# Patient Record
Sex: Female | Born: 1950
Health system: Southern US, Community
[De-identification: ages and names within clinical notes are randomized; demographics above are authoritative.]

## PROBLEM LIST (undated history)

## (undated) DIAGNOSIS — I10 Essential (primary) hypertension: Secondary | ICD-10-CM

## (undated) HISTORY — DX: Essential (primary) hypertension: I10

## (undated) HISTORY — PX: TUBAL LIGATION: SHX77

## (undated) HISTORY — PX: BREAST CYST EXCISION: SHX579

---

## 1999-12-29 ENCOUNTER — Encounter: Admission: RE | Admit: 1999-12-29 | Discharge: 1999-12-29 | Payer: Self-pay | Admitting: Obstetrics and Gynecology

## 1999-12-29 ENCOUNTER — Encounter: Payer: Self-pay | Admitting: Obstetrics and Gynecology

## 2000-10-07 ENCOUNTER — Encounter: Admission: RE | Admit: 2000-10-07 | Discharge: 2000-10-07 | Payer: Self-pay | Admitting: Obstetrics and Gynecology

## 2000-10-07 ENCOUNTER — Encounter: Payer: Self-pay | Admitting: Obstetrics and Gynecology

## 2000-10-27 ENCOUNTER — Encounter (INDEPENDENT_AMBULATORY_CARE_PROVIDER_SITE_OTHER): Payer: Self-pay | Admitting: *Deleted

## 2000-10-27 ENCOUNTER — Ambulatory Visit (HOSPITAL_BASED_OUTPATIENT_CLINIC_OR_DEPARTMENT_OTHER): Admission: RE | Admit: 2000-10-27 | Discharge: 2000-10-27 | Payer: Self-pay | Admitting: *Deleted

## 2000-11-08 HISTORY — PX: BREAST SURGERY: SHX581

## 2001-11-24 ENCOUNTER — Encounter: Admission: RE | Admit: 2001-11-24 | Discharge: 2001-11-24 | Payer: Self-pay | Admitting: Obstetrics and Gynecology

## 2001-11-24 ENCOUNTER — Encounter: Payer: Self-pay | Admitting: Obstetrics and Gynecology

## 2003-04-15 ENCOUNTER — Encounter: Payer: Self-pay | Admitting: Obstetrics and Gynecology

## 2003-04-15 ENCOUNTER — Encounter: Admission: RE | Admit: 2003-04-15 | Discharge: 2003-04-15 | Payer: Self-pay | Admitting: Obstetrics and Gynecology

## 2004-05-04 ENCOUNTER — Encounter: Admission: RE | Admit: 2004-05-04 | Discharge: 2004-05-04 | Payer: Self-pay | Admitting: Obstetrics and Gynecology

## 2005-05-10 ENCOUNTER — Encounter: Admission: RE | Admit: 2005-05-10 | Discharge: 2005-05-10 | Payer: Self-pay | Admitting: Obstetrics and Gynecology

## 2006-05-12 ENCOUNTER — Encounter: Admission: RE | Admit: 2006-05-12 | Discharge: 2006-05-12 | Payer: Self-pay | Admitting: Obstetrics and Gynecology

## 2006-08-25 ENCOUNTER — Ambulatory Visit: Payer: Self-pay | Admitting: Gastroenterology

## 2006-09-13 ENCOUNTER — Ambulatory Visit: Payer: Self-pay | Admitting: Gastroenterology

## 2007-05-19 ENCOUNTER — Encounter: Admission: RE | Admit: 2007-05-19 | Discharge: 2007-05-19 | Payer: Self-pay | Admitting: Obstetrics and Gynecology

## 2008-05-29 ENCOUNTER — Encounter: Admission: RE | Admit: 2008-05-29 | Discharge: 2008-05-29 | Payer: Self-pay | Admitting: Obstetrics and Gynecology

## 2009-06-23 ENCOUNTER — Encounter: Admission: RE | Admit: 2009-06-23 | Discharge: 2009-06-23 | Payer: Self-pay | Admitting: Obstetrics and Gynecology

## 2010-07-21 ENCOUNTER — Encounter: Admission: RE | Admit: 2010-07-21 | Discharge: 2010-07-21 | Payer: Self-pay | Admitting: Obstetrics and Gynecology

## 2011-03-26 NOTE — Op Note (Signed)
[facility]  Patient:    Lauren Williams, Lauren Williams                     MRN: [id number] Proc. Date: 10/27/00 Adm. Date:  [id number] Attending:  Kandis Mannan CC:         Katherine Roan, M.D.  Dario Guardian, M.D.   Operative Report  CCS [phone].  PREOPERATIVE DIAGNOSIS:  Mass, right breast.  POSTOPERATIVE DIAGNOSIS:  Mass, right breast.  PROCEDURE:  Excision of mass, right breast.  SURGEON:  Maisie Fus B. Samuella Cota, M.D.  ANESTHESIA:  1% Xylocaine local with anesthesia monitoring.  ANESTHESIOLOGIST:  Cliffton Asters. Ivin Booty, M.D., C.R.N.A. Daphine Deutscher.  DESCRIPTION OF PROCEDURE:  The patient was taken to the operating room and placed on the table in the supine position.  The right breast was prepped and draped as a sterile field.  A circumareolar incision was outlined from about 2 oclock to 7 oclock.  Xylocaine 1% local was used to infiltrate the skin and underlying breast tissue.  The circumareolar incision was then made and using the cautery, the tissue just beneath the nipple and areola was excised. The patient had a palpable transverse oblong mass.  A suture was placed through this for traction, and then the mass was excised with some normal-appearing breast tissue.  Nothing that I cut across looked suspicious for malignancy.  After the specimen had been removed, the wound was irrigated, hemostasis was obtained.  The wound was closed with several interrupted sutures of 3-0 Vicryl, and the circumareolar incision was then closed with a subcuticular running suture of 5-0 Vicryl.  Benzoin and half-inch Steri-Strips were used to reinforce the skin closure, a pressure dressing using 4 x 4s, ABD, and four-inch Hypafix was applied.  The specimen was sent fresh, but no frozen section was requested.  The patient seemed to tolerate the procedure well, was taken to the PACU in satisfactory condition. DD:  10/27/00 TD:  10/28/00 Job: [id number]

## 2011-07-20 ENCOUNTER — Other Ambulatory Visit: Payer: Self-pay | Admitting: Gynecology

## 2011-07-20 DIAGNOSIS — Z1231 Encounter for screening mammogram for malignant neoplasm of breast: Secondary | ICD-10-CM

## 2011-07-30 ENCOUNTER — Ambulatory Visit
Admission: RE | Admit: 2011-07-30 | Discharge: 2011-07-30 | Disposition: A | Discharge status: Home / Self Care | Payer: BC Managed Care – PPO | Source: Ambulatory Visit | Attending: Gynecology | Admitting: Gynecology

## 2011-07-30 DIAGNOSIS — Z1231 Encounter for screening mammogram for malignant neoplasm of breast: Secondary | ICD-10-CM

## 2011-09-07 ENCOUNTER — Other Ambulatory Visit: Payer: Self-pay | Admitting: Gynecology

## 2011-09-07 ENCOUNTER — Ambulatory Visit (INDEPENDENT_AMBULATORY_CARE_PROVIDER_SITE_OTHER): Payer: BC Managed Care – PPO | Admitting: Gynecology

## 2011-09-07 ENCOUNTER — Encounter: Payer: Self-pay | Admitting: Gynecology

## 2011-09-07 VITALS — BP 130/70 | Ht 63.5 in | Wt 150.0 lb

## 2011-09-07 DIAGNOSIS — I10 Essential (primary) hypertension: Secondary | ICD-10-CM

## 2011-09-07 DIAGNOSIS — Z13220 Encounter for screening for lipoid disorders: Secondary | ICD-10-CM

## 2011-09-07 DIAGNOSIS — B373 Candidiasis of vulva and vagina: Secondary | ICD-10-CM

## 2011-09-07 DIAGNOSIS — Z01419 Encounter for gynecological examination (general) (routine) without abnormal findings: Secondary | ICD-10-CM

## 2011-09-07 DIAGNOSIS — N898 Other specified noninflammatory disorders of vagina: Secondary | ICD-10-CM

## 2011-09-07 LAB — COMPREHENSIVE METABOLIC PANEL
AST: 18 U/L (ref 0–37)
Albumin: 4.1 g/dL (ref 3.5–5.2)
CO2: 30 mEq/L (ref 19–32)
Chloride: 98 mEq/L (ref 96–112)
Creat: 0.66 mg/dL (ref 0.50–1.10)
Glucose, Bld: 87 mg/dL (ref 70–99)

## 2011-09-07 MED ORDER — FLUCONAZOLE 150 MG PO TABS: 150.0000 mg | ORAL_TABLET | Freq: Once | ORAL | Status: AC

## 2011-09-07 NOTE — Progress Notes (Signed)
CORYNN SOLBERG September 29, 1951 [id number]        60 y.o.  for annual exam.  Former patient of Dr. Leota Sauers doing well.  Past medical history,surgical history, medications, allergies, family history and social history were all reviewed and documented in the EPIC chart. ROS:  Was performed and pertinent positives and negatives are included in the history.  Exam: chaperone present Filed Vitals:   09/07/11 0901  BP: 130/70   General appearance  Normal Skin grossly normal Head/Neck normal with no cervical or supraclavicular adenopathy thyroid normal Lungs  clear Cardiac RR, without RMG Abdominal  soft, nontender, without masses, organomegaly or hernia Breasts  examined lying and sitting without masses, retractions, discharge or axillary adenopathy. Pelvic  Ext/BUS/vagina  normal with atrophic genital changes white discharge noted  Cervix  normal    Uterus  retroverted, normal size, shape and contour, midline and mobile nontender   Adnexa  Without masses or tenderness    Anus and perineum  normal   Rectovaginal  normal sphincter tone without palpated masses or tenderness.    Assessment/Plan:  60 y.o. female for annual exam.    1. White discharge. KOH of wet prep is positive for yeast. We'll treat with Diflucan 150 mg x1, follow up if symptoms. 2. Health maintenance. SBE monthly reviewed just had mammogram which was normal. Continue annual mammography. Never had DEXA we'll schedule baseline bone density. Increase calcium vitamin D reviewed. Patient is up-to-date with her colonoscopy. Sees Dr. Katrinka Blazing for hypertension. She asked if I would do baseline labs and I ordered a CBC complete metabolic panel lipid profile urinalysis. She has a copy of her Pap smear from 2011 which is normal. She has no history of abnormal Pap smears in the past. I reviewed current screening guidelines was less frequent screening and she agrees with this we'll plan on an every 3 year screening. Pap was not done today.  Assuming she continues well from a gynecologic standpoint she'll see [place] in a year sooner as needed.    Dara Lords MD, 9:52 AM 09/07/2011

## 2011-09-08 ENCOUNTER — Telehealth: Payer: Self-pay | Admitting: *Deleted

## 2011-09-08 ENCOUNTER — Other Ambulatory Visit: Payer: Self-pay | Admitting: *Deleted

## 2011-09-08 DIAGNOSIS — E876 Hypokalemia: Secondary | ICD-10-CM

## 2011-09-08 MED ORDER — POTASSIUM CHLORIDE ER 10 MEQ PO TBCR: 20.0000 meq | EXTENDED_RELEASE_TABLET | Freq: Two times a day (BID) | ORAL | Status: DC

## 2011-09-08 NOTE — Telephone Encounter (Signed)
Left message on pt voicemail with the below note 

## 2011-09-08 NOTE — Telephone Encounter (Signed)
Pt went to pharmacy to pick up the OTC potassium 20 meq as directed on her recent lab due to low potassium. Pharmacy stated that there is not OTC potassium she can take to equal this amount. Pt would need a RX for this. Please advise.

## 2011-09-08 NOTE — Telephone Encounter (Signed)
Tell patient sorry, I thought that they did. I prescribed K-DUR, 20 mEq tablets one twice a day for 2 weeks then once a day and again reminded her to recheck her potassium level in one month

## 2011-09-14 ENCOUNTER — Ambulatory Visit (INDEPENDENT_AMBULATORY_CARE_PROVIDER_SITE_OTHER): Payer: BC Managed Care – PPO

## 2011-09-14 DIAGNOSIS — Z01419 Encounter for gynecological examination (general) (routine) without abnormal findings: Secondary | ICD-10-CM

## 2011-09-14 DIAGNOSIS — Z13820 Encounter for screening for osteoporosis: Secondary | ICD-10-CM

## 2011-10-21 ENCOUNTER — Encounter: Payer: Self-pay | Admitting: Gynecology

## 2012-02-01 ENCOUNTER — Telehealth: Payer: Self-pay

## 2012-02-01 MED ORDER — HYDROCHLOROTHIAZIDE 50 MG PO TABS: 50.0000 mg | ORAL_TABLET | Freq: Every day | ORAL | Status: DC

## 2012-02-01 NOTE — Telephone Encounter (Signed)
Patient called back. Informed. She said she will call Dr. Merri Brunette her primary care doctor for appt.  She said to thank Dr. Velvet Bathe for refilling it for one month until she can work it out with Dr. Katrinka Blazing. She is currently taking a potassium supplement.

## 2012-02-01 NOTE — Telephone Encounter (Signed)
Left message on cell phone to call me.

## 2012-02-01 NOTE — Telephone Encounter (Signed)
Hydrochlorothiazide is definitely why her potassium was low and she should be taking a potassium supplement. I can refill her x1 month for the hydrochlorothiazide but she needs to get in to see a primary physician as I do not treat hypertension on a regular basis.

## 2012-02-01 NOTE — Telephone Encounter (Signed)
Patient stopped by the office. Lauren Williams she has old RX that Dr. Elana Alm prescribed for her for HCTZ 50mg  that she takes daily for her BP. She only has #6 pills left and needs refill and wondered if you would prescribe for her?   She said that previously you prescribed Potassium for her because her level was low. She wondered if this HCTZ causing that problem?   She said she needs a refill for HCTZ or another RX for BP.  Please advise. Thx

## 2012-06-27 ENCOUNTER — Other Ambulatory Visit: Payer: Self-pay | Admitting: Gynecology

## 2012-06-27 DIAGNOSIS — Z1231 Encounter for screening mammogram for malignant neoplasm of breast: Secondary | ICD-10-CM

## 2012-08-08 ENCOUNTER — Ambulatory Visit
Admission: RE | Admit: 2012-08-08 | Discharge: 2012-08-08 | Disposition: A | Discharge status: Home / Self Care | Payer: BC Managed Care – PPO | Source: Ambulatory Visit | Attending: Gynecology | Admitting: Gynecology

## 2012-08-08 DIAGNOSIS — Z1231 Encounter for screening mammogram for malignant neoplasm of breast: Secondary | ICD-10-CM

## 2012-11-07 ENCOUNTER — Ambulatory Visit (INDEPENDENT_AMBULATORY_CARE_PROVIDER_SITE_OTHER): Payer: BC Managed Care – PPO | Admitting: Gynecology

## 2012-11-07 ENCOUNTER — Encounter: Payer: Self-pay | Admitting: Gynecology

## 2012-11-07 VITALS — BP 130/84 | Ht 62.75 in | Wt 153.0 lb

## 2012-11-07 DIAGNOSIS — N952 Postmenopausal atrophic vaginitis: Secondary | ICD-10-CM

## 2012-11-07 DIAGNOSIS — Z01419 Encounter for gynecological examination (general) (routine) without abnormal findings: Secondary | ICD-10-CM

## 2012-11-07 NOTE — Progress Notes (Signed)
Lauren Williams [birth year] [id number]        61 y.o.  G1P1 for annual exam.    Past medical history,surgical history, medications, allergies, family history and social history were all reviewed and documented in the EPIC chart. ROS:  Was performed and pertinent positives and negatives are included in the history.  Exam: Charity fundraiser Vitals:   11/07/12 1115  BP: 130/84  Height: 5' 2.75" (1.594 m)  Weight: 153 lb (69.4 kg)   General appearance  Normal Skin grossly normal Head/Neck normal with no cervical or supraclavicular adenopathy thyroid normal Lungs  clear Cardiac RR, without RMG Abdominal  soft, nontender, without masses, organomegaly or hernia Breasts  examined lying and sitting without masses, retractions, discharge or axillary adenopathy. Pelvic  Ext/BUS/vagina  normal with mild atrophic changes  Cervix  normal   Uterus  retroverted, normal size, shape and contour, midline and mobile nontender   Adnexa  Without masses or tenderness    Anus and perineum  normal   Rectovaginal  normal sphincter tone without palpated masses or tenderness.    Assessment/Plan:  61 y.o. G1P1 female for annual exam.   1. Postmenopausal. Doing well without significant symptoms. Mild genital atrophic changes. Not sexually active. 2. Pap smear discussed 11. No Pap smear done today. No history of abnormal Pap smears. Plan repeat next year at 3 year interval. 3. Mammography October 2013. Continue with annual mammography. SBE monthly reviewed. 4. Colonoscopy 5 years ago. She's going to call [facility] to see when they want to repeat this. 5. DEXA November 2012 normal. Plan repeat a 5 year interval. Increase calcium vitamin D reviewed. 6. Health maintenance. Patient's going to check with her primary who follows her for her hypertension to make sure they're also doing her cholesterol and other blood work. No blood work done today here. Follow up one year, sooner as needed.    Dara Lords MD, 11:29 AM 11/07/2012

## 2012-11-07 NOTE — Patient Instructions (Signed)
Check with [facility] to see when your colonoscopy is due. Check with your primary physician to make sure they are following your cholesterol diabetes and other blood work. Follow up with me in one year for annual exam.

## 2012-11-08 LAB — URINALYSIS W MICROSCOPIC + REFLEX CULTURE
Bacteria, UA: NONE SEEN
Crystals: NONE SEEN
Glucose, UA: NEGATIVE mg/dL
Protein, ur: NEGATIVE mg/dL
Specific Gravity, Urine: 1.009 (ref 1.005–1.030)

## 2013-07-30 ENCOUNTER — Other Ambulatory Visit: Payer: Self-pay

## 2013-07-30 DIAGNOSIS — Z1231 Encounter for screening mammogram for malignant neoplasm of breast: Secondary | ICD-10-CM

## 2013-08-16 ENCOUNTER — Ambulatory Visit
Admission: RE | Admit: 2013-08-16 | Discharge: 2013-08-16 | Disposition: A | Discharge status: Home / Self Care | Payer: BC Managed Care – PPO | Source: Ambulatory Visit

## 2013-08-16 DIAGNOSIS — Z1231 Encounter for screening mammogram for malignant neoplasm of breast: Secondary | ICD-10-CM

## 2013-11-09 ENCOUNTER — Ambulatory Visit (INDEPENDENT_AMBULATORY_CARE_PROVIDER_SITE_OTHER): Payer: BC Managed Care – PPO | Admitting: Gynecology

## 2013-11-09 ENCOUNTER — Other Ambulatory Visit (HOSPITAL_COMMUNITY)
Admission: RE | Admit: 2013-11-09 | Discharge: 2013-11-09 | Disposition: A | Discharge status: Home / Self Care | Payer: BC Managed Care – PPO | Source: Ambulatory Visit | Attending: Gynecology | Admitting: Gynecology

## 2013-11-09 ENCOUNTER — Encounter: Payer: Self-pay | Admitting: Gynecology

## 2013-11-09 VITALS — BP 122/78 | Ht 63.0 in | Wt 151.0 lb

## 2013-11-09 DIAGNOSIS — N8111 Cystocele, midline: Secondary | ICD-10-CM

## 2013-11-09 DIAGNOSIS — N814 Uterovaginal prolapse, unspecified: Secondary | ICD-10-CM

## 2013-11-09 DIAGNOSIS — Z01419 Encounter for gynecological examination (general) (routine) without abnormal findings: Secondary | ICD-10-CM

## 2013-11-09 DIAGNOSIS — Z1151 Encounter for screening for human papillomavirus (HPV): Secondary | ICD-10-CM | POA: Insufficient documentation

## 2013-11-09 LAB — URINALYSIS W MICROSCOPIC + REFLEX CULTURE
BILIRUBIN URINE: NEGATIVE
Bacteria, UA: NONE SEEN
Casts: NONE SEEN
Crystals: NONE SEEN
GLUCOSE, UA: NEGATIVE mg/dL
Hgb urine dipstick: NEGATIVE
Leukocytes, UA: NEGATIVE
NITRITE: NEGATIVE
Protein, ur: NEGATIVE mg/dL
Specific Gravity, Urine: >1.03 — ABNORMAL HIGH (ref 1.005–1.030)
UROBILINOGEN UA: 1 mg/dL (ref 0.0–1.0)
pH: 7 (ref 5.0–8.0)

## 2013-11-09 NOTE — Progress Notes (Signed)
Lauren Williams Nov 15, 1950 [id number]        63 y.o.  G1P1 for annual exam.  Doing well without complaints.  Past medical history,surgical history, problem list, medications, allergies, family history and social history were all reviewed and documented in the EPIC chart.  ROS:  Performed and pertinent positives and negatives are included in the history, assessment and plan .  Exam: [facility] assistant Filed Vitals:   11/09/13 0857  BP: 122/78  Height: 5\' 3"  (1.6 m)  Weight: 151 lb (68.493 kg)   General appearance  Normal Skin grossly normal Head/Neck normal with no cervical or supraclavicular adenopathy thyroid normal Lungs  clear Cardiac RR, without RMG Abdominal  soft, nontender, without masses, organomegaly or hernia Breasts  examined lying and sitting without masses, retractions, discharge or axillary adenopathy. Pelvic  Ext/BUS/vagina  generalized atrophic changes with first-degree cystocele and mild uterine prolapse. No significant rectocele.  Cervix  Normal with atrophic changes. Pap/HPV done  Uterus  anteverted, normal size, shape and contour, midline and mobile nontender   Adnexa  Without masses or tenderness    Anus and perineum  Normal   Rectovaginal  Normal sphincter tone without palpated masses or tenderness.    Assessment/Plan:  63 y.o. G1P1 female for annual exam.   1. Postmenopausal/atrophic genital changes. Patient asymptomatic without significant hot flushes, night sweats or vaginal dryness. Is not sexually active. No vaginal bleeding. Continue to monitor. Call if any vaginal bleeding. 2. Mild cystocele/uterine prolapse. Patient is asymptomatic without urinary symptoms or pressure. Will continue to monitor. 3. Pap smear 2011. Pap/HPV done today. No history of abnormal Pap smears previously. 4. Mammography 08/2013. Continue with annual mammography. SBE monthly reviewed. 5. DEXA 09/2011 normal. Repeat at 5 year interval. Increase calcium vitamin D  reviewed. 6. Colonoscopy 8-9 years ago. Never called gastroenterology to check when she is due as previously recommended. Patient agrees to call now. 7. Health maintenance. Patient reports recent routine blood work through her primary physician's office. Followup one year, sooner as needed.   Note: This document was prepared with digital dictation and possible smart phrase technology. Any transcriptional errors that result from this process are unintentional.   Dara LordsFONTAINE,Bonnie Roig P MD, 9:13 AM 11/09/2013

## 2013-11-09 NOTE — Addendum Note (Signed)
Addended by: Dayna BarkerGARDNER, Phat Dalton K on: 11/09/2013 09:29 AM   Modules accepted: Orders

## 2013-11-09 NOTE — Patient Instructions (Signed)
Followup in one year, sooner of any issues.

## 2014-08-06 ENCOUNTER — Other Ambulatory Visit: Payer: Self-pay

## 2014-08-06 DIAGNOSIS — Z1231 Encounter for screening mammogram for malignant neoplasm of breast: Secondary | ICD-10-CM

## 2014-08-20 ENCOUNTER — Ambulatory Visit
Admission: RE | Admit: 2014-08-20 | Discharge: 2014-08-20 | Disposition: A | Discharge status: Home / Self Care | Payer: BC Managed Care – PPO | Source: Ambulatory Visit

## 2014-08-20 DIAGNOSIS — Z1231 Encounter for screening mammogram for malignant neoplasm of breast: Secondary | ICD-10-CM

## 2014-08-23 ENCOUNTER — Other Ambulatory Visit: Payer: Self-pay

## 2014-09-09 ENCOUNTER — Encounter: Payer: Self-pay | Admitting: Gynecology

## 2014-11-29 ENCOUNTER — Encounter: Payer: BC Managed Care – PPO | Admitting: Gynecology

## 2014-12-10 ENCOUNTER — Ambulatory Visit (INDEPENDENT_AMBULATORY_CARE_PROVIDER_SITE_OTHER): Payer: BC Managed Care – PPO | Admitting: Gynecology

## 2014-12-10 ENCOUNTER — Encounter: Payer: Self-pay | Admitting: Gynecology

## 2014-12-10 VITALS — BP 120/74 | Ht 63.0 in | Wt 153.0 lb

## 2014-12-10 DIAGNOSIS — N952 Postmenopausal atrophic vaginitis: Secondary | ICD-10-CM

## 2014-12-10 DIAGNOSIS — Z01419 Encounter for gynecological examination (general) (routine) without abnormal findings: Secondary | ICD-10-CM

## 2014-12-10 DIAGNOSIS — N8111 Cystocele, midline: Secondary | ICD-10-CM

## 2014-12-10 DIAGNOSIS — N816 Rectocele: Secondary | ICD-10-CM

## 2014-12-10 DIAGNOSIS — N814 Uterovaginal prolapse, unspecified: Secondary | ICD-10-CM

## 2014-12-10 NOTE — Patient Instructions (Signed)
You may obtain a copy of any labs that were done today by logging onto MyChart as outlined in the instructions provided with your AVS (after visit summary). The office will not call with normal lab results but certainly if there are any significant abnormalities then we will contact you.   Health Maintenance, Female A healthy lifestyle and preventative care can promote health and wellness.  Maintain regular health, dental, and eye exams.  Eat a healthy diet. Foods like vegetables, fruits, whole grains, low-fat dairy products, and lean protein foods contain the nutrients you need without too many calories. Decrease your intake of foods high in solid fats, added sugars, and salt. Get information about a proper diet from your caregiver, if necessary.  Regular physical exercise is one of the most important things you can do for your health. Most adults should get at least 150 minutes of moderate-intensity exercise (any activity that increases your heart rate and causes you to sweat) each week. In addition, most adults need muscle-strengthening exercises on 2 or more days a week.   Maintain a healthy weight. The body mass index (BMI) is a screening tool to identify possible weight problems. It provides an estimate of body fat based on height and weight. Your caregiver can help determine your BMI, and can help you achieve or maintain a healthy weight. For adults 20 years and older:  A BMI below 18.5 is considered underweight.  A BMI of 18.5 to 24.9 is normal.  A BMI of 25 to 29.9 is considered overweight.  A BMI of 30 and above is considered obese.  Maintain normal blood lipids and cholesterol by exercising and minimizing your intake of saturated fat. Eat a balanced diet with plenty of fruits and vegetables. Blood tests for lipids and cholesterol should begin at age 61 and be repeated every 5 years. If your lipid or cholesterol levels are high, you are over 50, or you are a high risk for heart  disease, you may need your cholesterol levels checked more frequently.Ongoing high lipid and cholesterol levels should be treated with medicines if diet and exercise are not effective.  If you smoke, find out from your caregiver how to quit. If you do not use tobacco, do not start.  Lung cancer screening is recommended for adults aged 33 80 years who are at high risk for developing lung cancer because of a history of smoking. Yearly low-dose computed tomography (CT) is recommended for people who have at least a 30-pack-year history of smoking and are a current smoker or have quit within the past 15 years. A pack year of smoking is smoking an average of 1 pack of cigarettes a day for 1 year (for example: 1 pack a day for 30 years or 2 packs a day for 15 years). Yearly screening should continue until the smoker has stopped smoking for at least 15 years. Yearly screening should also be stopped for people who develop a health problem that would prevent them from having lung cancer treatment.  If you are pregnant, do not drink alcohol. If you are breastfeeding, be very cautious about drinking alcohol. If you are not pregnant and choose to drink alcohol, do not exceed 1 drink per day. One drink is considered to be 12 ounces (355 mL) of beer, 5 ounces (148 mL) of wine, or 1.5 ounces (44 mL) of liquor.  Avoid use of street drugs. Do not share needles with anyone. Ask for help if you need support or instructions about stopping  the use of drugs.  High blood pressure causes heart disease and increases the risk of stroke. Blood pressure should be checked at least every 1 to 2 years. Ongoing high blood pressure should be treated with medicines, if weight loss and exercise are not effective.  If you are 59 to 64 years old, ask your caregiver if you should take aspirin to prevent strokes.  Diabetes screening involves taking a blood sample to check your fasting blood sugar level. This should be done once every 3  years, after age 91, if you are within normal weight and without risk factors for diabetes. Testing should be considered at a younger age or be carried out more frequently if you are overweight and have at least 1 risk factor for diabetes.  Breast cancer screening is essential preventative care for women. You should practice "breast self-awareness." This means understanding the normal appearance and feel of your breasts and may include breast self-examination. Any changes detected, no matter how small, should be reported to a caregiver. Women in their 66s and 30s should have a clinical breast exam (CBE) by a caregiver as part of a regular health exam every 1 to 3 years. After age 101, women should have a CBE every year. Starting at age 100, women should consider having a mammogram (breast X-ray) every year. Women who have a family history of breast cancer should talk to their caregiver about genetic screening. Women at a high risk of breast cancer should talk to their caregiver about having an MRI and a mammogram every year.  Breast cancer gene (BRCA)-related cancer risk assessment is recommended for women who have family members with BRCA-related cancers. BRCA-related cancers include breast, ovarian, tubal, and peritoneal cancers. Having family members with these cancers may be associated with an increased risk for harmful changes (mutations) in the breast cancer genes BRCA1 and BRCA2. Results of the assessment will determine the need for genetic counseling and BRCA1 and BRCA2 testing.  The Pap test is a screening test for cervical cancer. Women should have a Pap test starting at age 57. Between ages 25 and 35, Pap tests should be repeated every 2 years. Beginning at age 37, you should have a Pap test every 3 years as long as the past 3 Pap tests have been normal. If you had a hysterectomy for a problem that was not cancer or a condition that could lead to cancer, then you no longer need Pap tests. If you are  between ages 50 and 76, and you have had normal Pap tests going back 10 years, you no longer need Pap tests. If you have had past treatment for cervical cancer or a condition that could lead to cancer, you need Pap tests and screening for cancer for at least 20 years after your treatment. If Pap tests have been discontinued, risk factors (such as a new sexual partner) need to be reassessed to determine if screening should be resumed. Some women have medical problems that increase the chance of getting cervical cancer. In these cases, your caregiver may recommend more frequent screening and Pap tests.  The human papillomavirus (HPV) test is an additional test that may be used for cervical cancer screening. The HPV test looks for the virus that can cause the cell changes on the cervix. The cells collected during the Pap test can be tested for HPV. The HPV test could be used to screen women aged 44 years and older, and should be used in women of any age  who have unclear Pap test results. After the age of 55, women should have HPV testing at the same frequency as a Pap test.  Colorectal cancer can be detected and often prevented. Most routine colorectal cancer screening begins at the age of 44 and continues through age 20. However, your caregiver may recommend screening at an earlier age if you have risk factors for colon cancer. On a yearly basis, your caregiver may provide home test kits to check for hidden blood in the stool. Use of a small camera at the end of a tube, to directly examine the colon (sigmoidoscopy or colonoscopy), can detect the earliest forms of colorectal cancer. Talk to your caregiver about this at age 86, when routine screening begins. Direct examination of the colon should be repeated every 5 to 10 years through age 13, unless early forms of pre-cancerous polyps or small growths are found.  Hepatitis C blood testing is recommended for all people born from [birth year] through 1966 and any  individual with known risks for hepatitis C.  Practice safe sex. Use condoms and avoid high-risk sexual practices to reduce the spread of sexually transmitted infections (STIs). Sexually active women aged 36 and younger should be checked for Chlamydia, which is a common sexually transmitted infection. Older women with new or multiple partners should also be tested for Chlamydia. Testing for other STIs is recommended if you are sexually active and at increased risk.  Osteoporosis is a disease in which the bones lose minerals and strength with aging. This can result in serious bone fractures. The risk of osteoporosis can be identified using a bone density scan. Women ages 20 and over and women at risk for fractures or osteoporosis should discuss screening with their caregivers. Ask your caregiver whether you should be taking a calcium supplement or vitamin D to reduce the rate of osteoporosis.  Menopause can be associated with physical symptoms and risks. Hormone replacement therapy is available to decrease symptoms and risks. You should talk to your caregiver about whether hormone replacement therapy is right for you.  Use sunscreen. Apply sunscreen liberally and repeatedly throughout the day. You should seek shade when your shadow is shorter than you. Protect yourself by wearing long sleeves, pants, a wide-brimmed hat, and sunglasses year round, whenever you are outdoors.  Notify your caregiver of new moles or changes in moles, especially if there is a change in shape or color. Also notify your caregiver if a mole is larger than the size of a pencil eraser.  Stay current with your immunizations. Document Released: 05/10/2011 Document Revised: 02/19/2013 Document Reviewed: 05/10/2011 [facility] Patient Information 2014 [employer].

## 2014-12-10 NOTE — Progress Notes (Signed)
Lenord Carbohyllis W Jacobs 13-Oct-1951 [id number]        64 y.o.  G1P1 for annual exam.  Several issues noted below.  Past medical history,surgical history, problem list, medications, allergies, family history and social history were all reviewed and documented as reviewed in the EPIC chart.  ROS:  Performed with pertinent positives and negatives included in the history, assessment and plan.   Additional significant findings :  none   Exam: [facility] [employer] personassistant Filed Vitals:   12/10/14 1101  BP: 120/74  Height: 5\' 3"  (1.6 m)  Weight: 153 lb (69.4 kg)   General appearance:  Normal affect, orientation and appearance. Skin: Grossly normal HEENT: Without gross lesions.  No cervical or supraclavicular adenopathy. Thyroid normal.  Lungs:  Clear without wheezing, rales or rhonchi Cardiac: RR, without RMG Abdominal:  Soft, nontender, without masses, guarding, rebound, organomegaly or hernia Breasts:  Examined lying and sitting without masses, retractions, discharge or axillary adenopathy. Pelvic:  Ext/BUS/vagina with generalized atrophic changes. First-degree cystocele, uterine prolapse and first-degree rectocele.  Cervix with atrophic changes  Uterus anteverted, normal size, shape and contour, midline and mobile nontender   Adnexa  Without masses or tenderness    Anus and perineum  Normal   Rectovaginal  Normal sphincter tone without palpated masses or tenderness.    Assessment/Plan:  64 y.o. G1P1 female for annual exam.   1. Postmenopausal/atrophic genital changes. Patient without significant symptoms of hot flashes, night sweats, vaginal dryness or dyspareunia. No vaginal bleeding. Continue to monitor. Report any vaginal bleeding. 2. Cystocele/rectocele/uterine prolapse. Stable on serial exams. Asymptomatic to the patient. Options again reviewed to include observation, pessary or surgery. Patient is not interested in intervention at this point the first just to monitor. Will follow up if issues  develop. 3. Pap smear/HPV negative 11/2013. No Pap smear done today. No history of significant abnormal Pap smears. Plan repeat Pap smear at 3-5 year interval per current screening guidelines. 4. Mammography 08/2014. Continue with annual mammography. S/P chemotherapy reviewed. 5. DEXA 2012 normal. Repeat next year at five-year interval. Increased calcium vitamin D reviewed. 6. Colonoscopy 2007 with recommended repeat interval 10 years per her history. 7. Health maintenance. No routine blood work done as this is done at her primary physician's office. Follow up 1 year, sooner as needed.     Dara LordsFONTAINE,TIMOTHY P MD, 11:20 AM 12/10/2014

## 2014-12-11 LAB — URINALYSIS W MICROSCOPIC + REFLEX CULTURE
BACTERIA UA: NONE SEEN
BILIRUBIN URINE: NEGATIVE
CASTS: NONE SEEN
Crystals: NONE SEEN
Glucose, UA: NEGATIVE mg/dL
HGB URINE DIPSTICK: NEGATIVE
KETONES UR: NEGATIVE mg/dL
Leukocytes, UA: NEGATIVE
NITRITE: NEGATIVE
Protein, ur: NEGATIVE mg/dL
SPECIFIC GRAVITY, URINE: 1.017 (ref 1.005–1.030)
Urobilinogen, UA: 1 mg/dL (ref 0.0–1.0)
pH: 7.5 (ref 5.0–8.0)

## 2015-07-22 ENCOUNTER — Other Ambulatory Visit: Payer: Self-pay

## 2015-07-22 DIAGNOSIS — Z1231 Encounter for screening mammogram for malignant neoplasm of breast: Secondary | ICD-10-CM

## 2015-09-09 ENCOUNTER — Ambulatory Visit
Admission: RE | Admit: 2015-09-09 | Discharge: 2015-09-09 | Disposition: A | Discharge status: Home / Self Care | Payer: BC Managed Care – PPO | Source: Ambulatory Visit

## 2015-09-09 DIAGNOSIS — Z1231 Encounter for screening mammogram for malignant neoplasm of breast: Secondary | ICD-10-CM

## 2015-12-18 ENCOUNTER — Ambulatory Visit (INDEPENDENT_AMBULATORY_CARE_PROVIDER_SITE_OTHER): Payer: BC Managed Care – PPO | Admitting: Gynecology

## 2015-12-18 ENCOUNTER — Encounter: Payer: Self-pay | Admitting: Gynecology

## 2015-12-18 VITALS — BP 122/78 | Ht 63.5 in | Wt 150.0 lb

## 2015-12-18 DIAGNOSIS — N8189 Other female genital prolapse: Secondary | ICD-10-CM

## 2015-12-18 DIAGNOSIS — N952 Postmenopausal atrophic vaginitis: Secondary | ICD-10-CM | POA: Diagnosis not present

## 2015-12-18 DIAGNOSIS — Z01419 Encounter for gynecological examination (general) (routine) without abnormal findings: Secondary | ICD-10-CM | POA: Diagnosis not present

## 2015-12-18 NOTE — Patient Instructions (Signed)

## 2015-12-18 NOTE — Progress Notes (Signed)
Lauren Williams 01/17/1951 [id number]        65 y.o.  G1P1  for annual exam.  Doing well without complaints  Past medical history,surgical history, problem list, medications, allergies, family history and social history were all reviewed and documented as reviewed in the EPIC chart.  ROS:  Performed with pertinent positives and negatives included in the history, assessment and plan.   Additional significant findings :  none   Exam: Kennon Portela assistant Filed Vitals:   12/18/15 0826  BP: 122/78  Height: 5' 3.5" (1.613 m)  Weight: 150 lb (68.04 kg)   General appearance:  Normal affect, orientation and appearance. Skin: Grossly normal HEENT: Without gross lesions.  No cervical or supraclavicular adenopathy. Thyroid normal.  Lungs:  Clear without wheezing, rales or rhonchi Cardiac: RR, without RMG Abdominal:  Soft, nontender, without masses, guarding, rebound, organomegaly or hernia Breasts:  Examined lying and sitting without masses, retractions, discharge or axillary adenopathy. Pelvic:  Ext/BUS/vagina with atrophic changes. First-degree cystocele and first-degree rectocele. Mild uterine prolapse.  Cervix with atrophic changes  Uterus anteverted, normal size, shape and contour, midline and mobile nontender   Adnexa without masses or tenderness    Anus and perineum normal   Rectovaginal normal sphincter tone without palpated masses or tenderness.    Assessment/Plan:  65 y.o. G1P1 female for annual exam.   1. Postmenopausal/atrophic genital changes. Patient without significant symptoms of hot flashes, night sweats, vaginal dryness or any vaginal bleeding. Continue monitoring report any issues or vaginal bleeding. 2. Pelvic relaxation. Cystocele/rectocele/uterine prolapse. Asymptomatic to the patient. Stable on serial exams. I again reviewed options to include observation, pessary and surgery. Patient is not interested in doing anything at this point follow up it becomes an  issue. 3. Mammography 09/2015. Continue with annual mammography when due. SBE monthly reviewed. Noted on last year's note 12/10/2014 and states "S/P chemotherapy reviewed". That was a dictation error that was supposed to be SBE monthly review. 4. Pap smear/HPV normal 11/2013. No Pap smear done today. No history of significant abnormal Pap smears. 5. DEXA 09/2011 normal. Plan repeat DEXA  Next year at a little over 5 year interval. Increased calcium and vitamin D reviewed. 6. Colonoscopy scheduled in October. 7. Health maintenance. No routine blood work done as patient reports this done elsewhere.  Follow up in one year, sooner as needed.   Dara Lords MD, 8:39 AM 12/18/2015

## 2015-12-19 LAB — URINALYSIS W MICROSCOPIC + REFLEX CULTURE
BILIRUBIN URINE: NEGATIVE
Casts: NONE SEEN [LPF]
Crystals: NONE SEEN [HPF]
GLUCOSE, UA: NEGATIVE
Hgb urine dipstick: NEGATIVE
Nitrite: NEGATIVE
SPECIFIC GRAVITY, URINE: 1.029 (ref 1.001–1.035)
Yeast: NONE SEEN [HPF]
pH: 6.5 (ref 5.0–8.0)

## 2015-12-21 LAB — URINE CULTURE: Colony Count: >=100000

## 2015-12-24 ENCOUNTER — Other Ambulatory Visit: Payer: Self-pay | Admitting: *Deleted

## 2015-12-24 MED ORDER — SULFAMETHOXAZOLE-TRIMETHOPRIM 800-160 MG PO TABS: 1.0000 | ORAL_TABLET | Freq: Two times a day (BID) | ORAL | Status: DC

## 2016-07-29 ENCOUNTER — Encounter: Payer: Self-pay | Admitting: Gastroenterology

## 2016-08-06 ENCOUNTER — Other Ambulatory Visit: Payer: Self-pay | Admitting: Family Medicine

## 2016-08-06 DIAGNOSIS — Z1231 Encounter for screening mammogram for malignant neoplasm of breast: Secondary | ICD-10-CM

## 2016-09-07 ENCOUNTER — Ambulatory Visit (AMBULATORY_SURGERY_CENTER): Payer: Self-pay

## 2016-09-07 VITALS — Ht 64.0 in | Wt 153.8 lb

## 2016-09-07 DIAGNOSIS — Z1211 Encounter for screening for malignant neoplasm of colon: Secondary | ICD-10-CM

## 2016-09-07 MED ORDER — SUPREP BOWEL PREP KIT 17.5-3.13-1.6 GM/177ML PO SOLN: 1.0000 | Freq: Once | ORAL | 0 refills | Status: AC

## 2016-09-07 NOTE — Progress Notes (Signed)
No allergies to eggs or soy No past problems with anesthesia No diet meds No home oxygen  Declined emmi 

## 2016-09-10 ENCOUNTER — Ambulatory Visit
Admission: RE | Admit: 2016-09-10 | Discharge: 2016-09-10 | Disposition: A | Discharge status: Home / Self Care | Payer: BC Managed Care – PPO | Source: Ambulatory Visit | Attending: Family Medicine | Admitting: Family Medicine

## 2016-09-10 DIAGNOSIS — Z1231 Encounter for screening mammogram for malignant neoplasm of breast: Secondary | ICD-10-CM

## 2016-09-14 ENCOUNTER — Encounter: Payer: Self-pay | Admitting: Gastroenterology

## 2016-09-20 ENCOUNTER — Encounter: Payer: BC Managed Care – PPO | Admitting: Gastroenterology

## 2016-09-24 ENCOUNTER — Ambulatory Visit (AMBULATORY_SURGERY_CENTER): Payer: BC Managed Care – PPO | Admitting: Gastroenterology

## 2016-09-24 ENCOUNTER — Encounter: Payer: Self-pay | Admitting: Gastroenterology

## 2016-09-24 VITALS — BP 125/58 | HR 56 | Temp 97.5°F | Resp 10 | Ht 63.0 in | Wt 150.0 lb

## 2016-09-24 DIAGNOSIS — Z1211 Encounter for screening for malignant neoplasm of colon: Secondary | ICD-10-CM | POA: Diagnosis not present

## 2016-09-24 DIAGNOSIS — Z1212 Encounter for screening for malignant neoplasm of rectum: Secondary | ICD-10-CM | POA: Diagnosis not present

## 2016-09-24 MED ORDER — SODIUM CHLORIDE 0.9 % IV SOLN: 500.0000 mL | INTRAVENOUS | Status: AC

## 2016-09-24 NOTE — Patient Instructions (Signed)
Impression/Recommendations:  Repeat colonoscopy in 10 years for screening purposes.  YOU HAD AN ENDOSCOPIC PROCEDURE TODAY AT THE [facility]:   Refer to the procedure report that was given to you for any specific questions about what was found during the examination.  If the procedure report does not answer your questions, please call your gastroenterologist to clarify.  If you requested that your care partner not be given the details of your procedure findings, then the procedure report has been included in a sealed envelope for you to review at your convenience later.  YOU SHOULD EXPECT: Some feelings of bloating in the abdomen. Passage of more gas than usual.  Walking can help get rid of the air that was put into your GI tract during the procedure and reduce the bloating. If you had a lower endoscopy (such as a colonoscopy or flexible sigmoidoscopy) you may notice spotting of blood in your stool or on the toilet paper. If you underwent a bowel prep for your procedure, you may not have a normal bowel movement for a few days.  Please Note:  You might notice some irritation and congestion in your nose or some drainage.  This is from the oxygen used during your procedure.  There is no need for concern and it should clear up in a day or so.  SYMPTOMS TO REPORT IMMEDIATELY:   Following lower endoscopy (colonoscopy or flexible sigmoidoscopy):  Excessive amounts of blood in the stool  Significant tenderness or worsening of abdominal pains  Swelling of the abdomen that is new, acute  Fever of 100F or higher For urgent or emergent issues, a gastroenterologist can be reached at any hour by calling [phone].   DIET:  We do recommend a small meal at first, but then you may proceed to your regular diet.  Drink plenty of fluids but you should avoid alcoholic beverages for 24 hours.  ACTIVITY:  You should plan to take it easy for the rest of today and you should NOT DRIVE or use heavy  machinery until tomorrow (because of the sedation medicines used during the test).    FOLLOW UP: Our staff will call the number listed on your records the next business day following your procedure to check on you and address any questions or concerns that you may have regarding the information given to you following your procedure. If we do not reach you, we will leave a message.  However, if you are feeling well and you are not experiencing any problems, there is no need to return our call.  We will assume that you have returned to your regular daily activities without incident.  If any biopsies were taken you will be contacted by phone or by letter within the next 1-3 weeks.  Please call us at [phone] if you have not heard about the biopsies in 3 weeks.    SIGNATURES/CONFIDENTIALITY: You and/or your care partner have signed paperwork which will be entered into your electronic medical record.  These signatures attest to the fact that that the information above on your After Visit Summary has been reviewed and is understood.  Full responsibility of the confidentiality of this discharge information lies with you and/or your care-partner. 

## 2016-09-24 NOTE — Op Note (Signed)
[facility] Patient Name: Lauren Williams Procedure Date: 09/24/2016 1:41 PM MRN: [id number] Endoscopist: Sherilyn CooterHenry L. Myrtie Neitheranis , MD Age: 65 Referring MD:  Date of Birth: Dec 31, 1950 Gender: Female Account #: [id number] Procedure:                Colonoscopy Indications:              Screening for colorectal malignant neoplasm (normal                            colonoscopy in 2007) Medicines:                Monitored Anesthesia Care Procedure:                Pre-Anesthesia Assessment:                           - Prior to the procedure, a History and Physical                            was performed, and patient medications and                            allergies were reviewed. The patient's tolerance of                            previous anesthesia was also reviewed. The risks                            and benefits of the procedure and the sedation                            options and risks were discussed with the patient.                            All questions were answered, and informed consent                            was obtained. Prior Anticoagulants: The patient has                            taken no previous anticoagulant or antiplatelet                            agents. ASA Grade Assessment: II - A patient with                            mild systemic disease. After reviewing the risks                            and benefits, the patient was deemed in                            satisfactory condition to undergo the procedure.  After obtaining informed consent, the colonoscope                            was passed under direct vision. Throughout the                            procedure, the patient's blood pressure, pulse, and                            oxygen saturations were monitored continuously. The                            Model CF-HQ190L [id number]) scope was introduced                            through the anus and advanced to  the the cecum,                            identified by appendiceal orifice and ileocecal                            valve. The ileocecal valve, appendiceal orifice,                            and rectum were photographed. The quality of the                            bowel preparation was excellent. The colonoscopy                            was performed without difficulty. The patient                            tolerated the procedure well. The bowel preparation                            used was SUPREP. Scope In: 1:55:53 PM Scope Out: 2:07:17 PM Scope Withdrawal Time: 0 hours 7 minutes 48 seconds  Total Procedure Duration: 0 hours 11 minutes 24 seconds  Findings:                 The entire examined colon appeared normal on direct                            and retroflexion views. Complications:            No immediate complications. Estimated blood loss:                            None. Estimated Blood Loss:     Estimated blood loss: none. Impression:               - The entire examined colon is normal on direct and  retroflexion views.                           - No specimens collected. Recommendation:           - Repeat colonoscopy in 10 years for screening                            purposes.                           - Patient has a contact number available for                            emergencies. The signs and symptoms of potential                            delayed complications were discussed with the                            patient. Return to normal activities tomorrow.                            Written discharge instructions were provided to the                            patient.                           - Resume previous diet.                           - Continue present medications. Henry L. Myrtie Neitheranis, MD 09/24/2016 2:11:54 PM This report has been signed electronically.

## 2016-09-24 NOTE — Progress Notes (Signed)
To recovery vss Report to RN 

## 2016-09-27 ENCOUNTER — Telehealth: Payer: Self-pay | Admitting: *Deleted

## 2016-09-27 NOTE — Telephone Encounter (Signed)
  Follow up Call-  Call back number 09/24/2016  Post procedure Call Back phone  # [phone]  Permission to leave phone message Yes  Some recent data might be hidden     Patient questions:  Do you have a fever, pain , or abdominal swelling? No. Pain Score  0 *  Have you tolerated food without any problems? Yes.    Have you been able to return to your normal activities? Yes.    Do you have any questions about your discharge instructions: Diet   No. Medications  No. Follow up visit  No.  Do you have questions or concerns about your Care? No.  Actions: * If pain score is 4 or above: No action needed, pain <4.

## 2016-12-20 ENCOUNTER — Ambulatory Visit (INDEPENDENT_AMBULATORY_CARE_PROVIDER_SITE_OTHER): Payer: BC Managed Care – PPO | Admitting: Gynecology

## 2016-12-20 ENCOUNTER — Encounter: Payer: Self-pay | Admitting: Gynecology

## 2016-12-20 VITALS — BP 120/74 | Ht 63.0 in | Wt 147.0 lb

## 2016-12-20 DIAGNOSIS — N952 Postmenopausal atrophic vaginitis: Secondary | ICD-10-CM

## 2016-12-20 DIAGNOSIS — Z01411 Encounter for gynecological examination (general) (routine) with abnormal findings: Secondary | ICD-10-CM | POA: Diagnosis not present

## 2016-12-20 DIAGNOSIS — N8189 Other female genital prolapse: Secondary | ICD-10-CM

## 2016-12-20 NOTE — Patient Instructions (Signed)
follow up for bone density as scheduled.

## 2016-12-20 NOTE — Progress Notes (Signed)
    Lauren Williams Mar 27, 1951 [id number]        66 y.o.  G1P1 for annual exam.    Past medical history,surgical history, problem list, medications, allergies, family history and social history were all reviewed and documented as reviewed in the EPIC chart.  ROS:  Performed with pertinent positives and negatives included in the history, assessment and plan.   Additional significant findings :  none   Exam: Kennon PortelaKim Gardner assistant Vitals:   12/20/16 0822  BP: 120/74  Weight: 147 lb (66.7 kg)  Height: 5\' 3"  (1.6 m)   Body mass index is 26.04 kg/m.  General appearance:  Normal affect, orientation and appearance. Skin: Grossly normal HEENT: Without gross lesions.  No cervical or supraclavicular adenopathy. Thyroid normal.  Lungs:  Clear without wheezing, rales or rhonchi Cardiac: RR, without RMG Abdominal:  Soft, nontender, without masses, guarding, rebound, organomegaly or hernia Breasts:  Examined lying and sitting without masses, retractions, discharge or axillary adenopathy. Pelvic:  Ext, BUS, Vagina with atrophic changes. First-degree cystocele, rectocele and uterine prolapse  Cervix normal with atrophic changes  Uterus anteverted, normal size, shape and contour, midline and mobile nontender   Adnexa without masses or tenderness    Anus and perineum normal   Rectovaginal normal sphincter tone without palpated masses or tenderness.    Assessment/Plan:  66 y.o. G1P1 female for annual exam.   1. Post menopausal/atrophic genital changes. No significant hot flushes, night sweats, vaginal dryness or any vaginal bleeding. Continue to monitor report any issues or vaginal bleeding. 2. Pelvic relaxation. Without symptoms of pressure, urinary leakage urgency or frequency. Stable on serial exams. Reviewed with patient and discussed options to include pessary and surgery. At this point given patient's asymptomatic we'll follow and she'll report any issues. 3. Mammography 09/2016.  Continue with annual mammography when due. SBE monthly reviewed. 4. Colonoscopy 2017. Repeat at their recommended interval. 5. DEXA 2012 normal. Schedule DEXA now at 5 year interval and she agrees to do so. 6. Pap smear/HPV 2015. No Pap smear done today. No history of significant abnormal Pap smears. 7. Health maintenance. No routine lab work done as patient reports this done elsewhere. Follow up for bone density otherwise follow up in one year, sooner as needed.   Dara LordsFONTAINE,Amparo Donalson P MD, 8:42 AM 12/20/2016

## 2016-12-30 ENCOUNTER — Ambulatory Visit (INDEPENDENT_AMBULATORY_CARE_PROVIDER_SITE_OTHER): Payer: BC Managed Care – PPO

## 2016-12-30 ENCOUNTER — Other Ambulatory Visit: Payer: Self-pay | Admitting: Gynecology

## 2016-12-30 ENCOUNTER — Encounter: Payer: Self-pay | Admitting: Gynecology

## 2016-12-30 DIAGNOSIS — Z780 Asymptomatic menopausal state: Secondary | ICD-10-CM | POA: Diagnosis not present

## 2016-12-30 DIAGNOSIS — Z01411 Encounter for gynecological examination (general) (routine) with abnormal findings: Secondary | ICD-10-CM

## 2016-12-30 DIAGNOSIS — Z13820 Encounter for screening for osteoporosis: Secondary | ICD-10-CM

## 2017-08-09 ENCOUNTER — Other Ambulatory Visit: Payer: Self-pay | Admitting: Family Medicine

## 2017-08-09 DIAGNOSIS — Z1231 Encounter for screening mammogram for malignant neoplasm of breast: Secondary | ICD-10-CM

## 2017-09-14 ENCOUNTER — Ambulatory Visit
Admission: RE | Admit: 2017-09-14 | Discharge: 2017-09-14 | Disposition: A | Discharge status: Home / Self Care | Payer: Medicare Other | Source: Ambulatory Visit | Attending: Family Medicine | Admitting: Family Medicine

## 2017-09-14 DIAGNOSIS — Z1231 Encounter for screening mammogram for malignant neoplasm of breast: Secondary | ICD-10-CM

## 2017-12-21 ENCOUNTER — Encounter: Payer: Self-pay | Admitting: Gynecology

## 2017-12-21 ENCOUNTER — Ambulatory Visit: Payer: Medicare Other | Admitting: Gynecology

## 2017-12-21 VITALS — BP 124/70 | Ht 63.0 in | Wt 147.0 lb

## 2017-12-21 DIAGNOSIS — Z01411 Encounter for gynecological examination (general) (routine) with abnormal findings: Secondary | ICD-10-CM

## 2017-12-21 DIAGNOSIS — N952 Postmenopausal atrophic vaginitis: Secondary | ICD-10-CM | POA: Diagnosis not present

## 2017-12-21 DIAGNOSIS — N8189 Other female genital prolapse: Secondary | ICD-10-CM

## 2017-12-21 NOTE — Patient Instructions (Signed)
Follow-up in 1 year for annual exam, sooner if any issues. 

## 2017-12-21 NOTE — Progress Notes (Signed)
    Lenord Carbohyllis W Eynon 11-06-1951 [id number]        67 y.o.  G1P1 for annual gynecologic exam.  Doing well without gynecologic complaints  Past medical history,surgical history, problem list, medications, allergies, family history and social history were all reviewed and documented as reviewed in the EPIC chart.  ROS:  Performed with pertinent positives and negatives included in the history, assessment and plan.   Additional significant findings : None   Exam: Kennon PortelaKim Gardner assistant Vitals:   12/21/17 1030  BP: 124/70  Weight: 147 lb (66.7 kg)  Height: 5\' 3"  (1.6 m)   Body mass index is 26.04 kg/m.  General appearance:  Normal affect, orientation and appearance. Skin: Grossly normal HEENT: Without gross lesions.  No cervical or supraclavicular adenopathy. Thyroid normal.  Lungs:  Clear without wheezing, rales or rhonchi Cardiac: RR, without RMG Abdominal:  Soft, nontender, without masses, guarding, rebound, organomegaly or hernia Breasts:  Examined lying and sitting without masses, retractions, discharge or axillary adenopathy. Pelvic:  Ext, BUS, Vagina: With atrophic changes.  Mild cystocele, rectocele and uterine prolapse noted  Cervix: With atrophic changes  Uterus: Retroverted, normal size, shape and contour, midline and mobile nontender   Adnexa: Without masses or tenderness    Anus and perineum: Normal   Rectovaginal: Normal sphincter tone without palpated masses or tenderness.    Assessment/Plan:  67 y.o. G1P1 female for annual gynecologic exam.   1. Postmenopausal/atrophic genital changes.  No significant hot flushes, night sweats, vaginal dryness or any vaginal bleeding.  Continue to monitor and report any issues or bleeding. 2. Pelvic relaxation.  Mild cystocele/rectocele/uterine prolapse.  Without pressure or urinary symptoms.  Stable on serial exams.  Discussed with patient.  Continue to monitor. 3. Mammography 09/2017.  Continue with annual mammography when due.   Breast exam normal today. 4. DEXA 2018 normal.  Plan repeat DEXA at 5-year interval. 5. Pap smear/HPV 2015.  No Pap smear done today.  Plan repeat Pap smear at 5-year interval per current screening guidelines.  Options to stop screening based on age also discussed.  Will readdress on an annual basis. 6. Colonoscopy 2017.  Repeat at their recommended interval. 7. Health maintenance.  No routine lab work done as patient does this elsewhere.  Follow-up 1 year, sooner as needed.   Dara Lordsimothy P Paytin Ramakrishnan MD, 11:00 AM 12/21/2017

## 2018-08-09 ENCOUNTER — Other Ambulatory Visit: Payer: Self-pay | Admitting: Family Medicine

## 2018-08-09 DIAGNOSIS — Z1231 Encounter for screening mammogram for malignant neoplasm of breast: Secondary | ICD-10-CM

## 2018-09-19 ENCOUNTER — Ambulatory Visit
Admission: RE | Admit: 2018-09-19 | Discharge: 2018-09-19 | Disposition: A | Discharge status: Home / Self Care | Payer: Medicare Other | Source: Ambulatory Visit | Attending: Family Medicine | Admitting: Family Medicine

## 2018-09-19 DIAGNOSIS — Z1231 Encounter for screening mammogram for malignant neoplasm of breast: Secondary | ICD-10-CM

## 2018-12-22 ENCOUNTER — Encounter: Payer: Medicare Other | Admitting: Gynecology

## 2019-01-05 ENCOUNTER — Encounter: Payer: Self-pay | Admitting: Gynecology

## 2019-01-05 ENCOUNTER — Ambulatory Visit: Payer: Medicare Other | Admitting: Gynecology

## 2019-01-05 VITALS — BP 120/72 | Ht 63.0 in | Wt 148.0 lb

## 2019-01-05 DIAGNOSIS — Z01419 Encounter for gynecological examination (general) (routine) without abnormal findings: Secondary | ICD-10-CM | POA: Diagnosis not present

## 2019-01-05 DIAGNOSIS — N8189 Other female genital prolapse: Secondary | ICD-10-CM

## 2019-01-05 DIAGNOSIS — N952 Postmenopausal atrophic vaginitis: Secondary | ICD-10-CM | POA: Diagnosis not present

## 2019-01-05 NOTE — Progress Notes (Signed)
    Lauren Williams Aug 10, 1951 [id number]        68 y.o.  G1P1 for annual gynecologic exam.  Without gynecologic complaints  Past medical history,surgical history, problem list, medications, allergies, family history and social history were all reviewed and documented as reviewed in the EPIC chart.  ROS:  Performed with pertinent positives and negatives included in the history, assessment and plan.   Additional significant findings : None   Exam: Kennon Portela assistant Vitals:   01/05/19 0935  BP: 120/72  Weight: 148 lb (67.1 kg)  Height: 5\' 3"  (1.6 m)   Body mass index is 26.22 kg/m.  General appearance:  Normal affect, orientation and appearance. Skin: Grossly normal HEENT: Without gross lesions.  No cervical or supraclavicular adenopathy. Thyroid normal.  Lungs:  Clear without wheezing, rales or rhonchi Cardiac: RR, without RMG Abdominal:  Soft, nontender, without masses, guarding, rebound, organomegaly or hernia Breasts:  Examined lying and sitting without masses, retractions, discharge or axillary adenopathy. Pelvic:  Ext, BUS, Vagina: With atrophic changes.  Mild cystocele/rectocele and uterine prolapse.  Cervix: With atrophic changes.  Pap smear done  Uterus: Normal size, shape and contour, midline and mobile nontender   Adnexa: Without masses or tenderness    Anus and perineum: Normal   Rectovaginal: Normal sphincter tone without palpated masses or tenderness.    Assessment/Plan:  68 y.o. G1P1 female for annual gynecologic exam.  1. Postmenopausal.  No significant menopausal symptoms or any vaginal bleeding. 2. Pelvic relaxation.  Mild cystocele/rectocele and uterine prolapse.  Stable on serial exams.  Asymptomatic to the patient.  Continue to follow with annual exams.  Patient report any symptoms such as increasing pressure or bulging. 3. Mammography 09/2018.  Continue with annual mammography when due.  Breast exam normal today. 4. Colonoscopy 2017.  Repeat at  their recommended interval. 5. DEXA 2018 normal.  Plan repeat DEXA at 5-year interval. 6. Pap smear/HPV 2015.  Pap smear done today.  No history of abnormal Pap smears.  We discussed current screening guidelines and options to stop screening.  Will readdress when due for next Pap smear. 7. Health maintenance.  No routine lab work done as patient does this elsewhere.  Follow-up 1 year, sooner as needed.   Dara Lords MD, 9:55 AM 01/05/2019

## 2019-01-05 NOTE — Patient Instructions (Signed)
Follow-up in 1 year for annual exam, sooner if any issues. 

## 2019-01-05 NOTE — Addendum Note (Signed)
Addended by: Dayna Barker on: 01/05/2019 10:01 AM   Modules accepted: Orders

## 2019-01-08 LAB — PAP IG W/ RFLX HPV ASCU

## 2019-08-15 ENCOUNTER — Encounter: Payer: Self-pay | Admitting: Gynecology

## 2019-08-29 ENCOUNTER — Other Ambulatory Visit: Payer: Self-pay | Admitting: Family Medicine

## 2019-08-29 DIAGNOSIS — Z1231 Encounter for screening mammogram for malignant neoplasm of breast: Secondary | ICD-10-CM

## 2019-10-18 ENCOUNTER — Ambulatory Visit
Admission: RE | Admit: 2019-10-18 | Discharge: 2019-10-18 | Disposition: A | Discharge status: Home / Self Care | Payer: Medicare Other | Source: Ambulatory Visit | Attending: Family Medicine | Admitting: Family Medicine

## 2019-10-18 ENCOUNTER — Other Ambulatory Visit: Payer: Self-pay

## 2019-10-18 DIAGNOSIS — Z1231 Encounter for screening mammogram for malignant neoplasm of breast: Secondary | ICD-10-CM

## 2019-11-30 ENCOUNTER — Ambulatory Visit: Payer: Medicare PPO | Attending: Internal Medicine

## 2019-11-30 DIAGNOSIS — Z23 Encounter for immunization: Secondary | ICD-10-CM | POA: Insufficient documentation

## 2019-12-03 NOTE — Progress Notes (Signed)
   Covid-19 Vaccination Clinic  Name:  Lauren Williams    MRN: [id number] DOB: [birth year]  11/30/2019  [name] was observed post Covid-19 immunization for 15 minutes without incidence. She was provided with Vaccine Information Sheet and instruction to access the V-Safe system.   [name] was instructed to call 911 with any severe reactions post vaccine: Marland Kitchen Difficulty breathing  . Swelling of your face and throat  . A fast heartbeat  . A bad rash all over your body  . Dizziness and weakness    Immunizations Administered    Name Date Dose VIS Date Route   Moderna COVID-19 Vaccine 11/30/2019 12:15 PM 0.5 mL 10/09/2019 Intramuscular   Manufacturer: Moderna   Lot: [id number]   NDC: 98286-751-98

## 2019-12-28 ENCOUNTER — Ambulatory Visit: Payer: Medicare PPO

## 2020-01-04 ENCOUNTER — Ambulatory Visit: Payer: Medicare PPO | Attending: Internal Medicine

## 2020-01-04 DIAGNOSIS — Z23 Encounter for immunization: Secondary | ICD-10-CM | POA: Insufficient documentation

## 2020-01-04 NOTE — Progress Notes (Signed)
   Covid-19 Vaccination Clinic  Name:  DONYAE KOHN    MRN: [id number] DOB: 1951/02/19  01/04/2020  Ms. Dimaria was observed post Covid-19 immunization for 15 minutes without incidence. She was provided with Vaccine Information Sheet and instruction to access the V-Safe system.   Ms. Boughner was instructed to call 911 with any severe reactions post vaccine: Marland Kitchen Difficulty breathing  . Swelling of your face and throat  . A fast heartbeat  . A bad rash all over your body  . Dizziness and weakness    Immunizations Administered    Name Date Dose VIS Date Route   Moderna COVID-19 Vaccine 01/04/2020 11:04 AM 0.5 mL 10/09/2019 Intramuscular   Manufacturer: Moderna   Lot: [id number]   NDC: 25956-387-56

## 2020-01-09 ENCOUNTER — Other Ambulatory Visit: Payer: Self-pay

## 2020-01-10 ENCOUNTER — Encounter: Payer: Self-pay | Admitting: Obstetrics and Gynecology

## 2020-01-10 ENCOUNTER — Encounter: Payer: Medicare Other | Admitting: Gynecology

## 2020-01-10 ENCOUNTER — Ambulatory Visit: Payer: Medicare PPO | Admitting: Obstetrics and Gynecology

## 2020-01-10 VITALS — BP 118/76 | Ht 63.0 in | Wt 148.0 lb

## 2020-01-10 DIAGNOSIS — Z01419 Encounter for gynecological examination (general) (routine) without abnormal findings: Secondary | ICD-10-CM | POA: Diagnosis not present

## 2020-01-10 DIAGNOSIS — N8189 Other female genital prolapse: Secondary | ICD-10-CM

## 2020-01-10 NOTE — Progress Notes (Signed)
ROCSI HAZELBAKER Apr 02, 1951 [id number]  SUBJECTIVE:  69 y.o. G1P1 female for annual routine gynecologic exam. She has no gynecologic concerns.  Current Outpatient Medications  Medication Sig Dispense Refill  . Cyanocobalamin (VITAMIN B 12 PO) Take 1,000 mcg by mouth.    . DHA-EPA-Vitamin E (OMEGA-3 COMPLEX PO) Take 1,600 mg by mouth.    . Fluocinolone Acetonide Scalp 0.01 % OIL APPLY 1 ML TO SCALP DAILY AS DIRECTED.  0  . lisinopril-hydrochlorothiazide (ZESTORETIC) 20-12.5 MG tablet Take 1 tablet by mouth daily.    . Multiple Vitamins-Minerals (MULTIVITAMIN ADULT PO) Take by mouth. Gummies with Biotin    . potassium chloride (K-DUR,KLOR-CON) 10 MEQ tablet Take 10 mEq by mouth 2 (two) times daily.    Marland Kitchen VITAMIN D PO Take by mouth.     Current Facility-Administered Medications  Medication Dose Route Frequency Provider Last Rate Last Admin  . 0.9 %  sodium chloride infusion  500 mL Intravenous Continuous Charlie Pitter III, MD       Allergies: Amoxicillin  Patient's last menstrual period was 05/07/2004.  Past medical history,surgical history, problem list, medications, allergies, family history and social history were all reviewed and documented as reviewed in the EPIC chart.  ROS:  Feeling well. No dyspnea or chest pain on exertion.  No abdominal pain, change in bowel habits, black or bloody stools.  No urinary tract symptoms. GYN ROS: no abnormal bleeding, pelvic pain or discharge, no breast pain or new or enlarging lumps on self exam. No neurological complaints.    OBJECTIVE:  BP 118/76   Ht 5\' 3"  (1.6 m)   Wt 148 lb (67.1 kg)   LMP 05/07/2004   BMI 26.22 kg/m  The patient appears well, alert, oriented x 3, in no distress. ENT normal.  Neck supple. No cervical or supraclavicular adenopathy or thyromegaly.  Lungs are clear, good air entry, no wheezes, rhonchi or rales. S1 and S2 normal, no murmurs, regular rate and rhythm.  Abdomen soft without tenderness, guarding, mass or  organomegaly.  Neurological is normal, no focal findings.  BREAST EXAM: breasts appear normal, no suspicious masses, no skin or nipple changes or axillary nodes  PELVIC EXAM: VULVA: normal appearing vulva with no masses, tenderness or lesions, VAGINA: normal appearing vagina with normal color and discharge, no lesions, grade 1 cystocele, CERVIX: normal appearing cervix without discharge or lesions, UTERUS: uterus is normal size, shape, consistency and nontender, ADNEXA: normal adnexa in size, nontender and no masses, RECTAL: Mild grade 1 rectocele, normal rectal, no masses  Chaperone: 05/09/2004 present during the examination  ASSESSMENT:  69 y.o. G1P1 here for annual gynecologic exam  PLAN:   1.  Postmenopausal.  No significant symptoms or vaginal bleeding. 2.  Pelvic organ prolapse/relaxation.  Mild cystocele rectocele noted.  Asymptomatic.  Continue to follow with regular exams. 3. Pap smear 12/2018.  No history of abnormal Pap smears.  Next Pap smear would be due 2023, we will readdress if there is desire to continue Pap smear surveillance at that time. 4. Mammogram 10/2019.  Normal breast exam today.  She is reminded to schedule an annual mammogram this year when due. 5. Colonoscopy 2017.  Recommended that she follow up at the recommended interval.   6. DEXA 2018 was normal.   Next DEXA recommended at 5-year interval 2023 so she plans to schedule this when due. 7. Health maintenance.  No labs today as she normally has these completed with her primary care provider.   Return annually  or sooner, prn.  Joseph Pierini MD  01/10/20

## 2020-08-11 ENCOUNTER — Other Ambulatory Visit: Payer: Self-pay | Admitting: Family Medicine

## 2020-08-11 DIAGNOSIS — Z1231 Encounter for screening mammogram for malignant neoplasm of breast: Secondary | ICD-10-CM

## 2020-10-21 ENCOUNTER — Ambulatory Visit
Admission: RE | Admit: 2020-10-21 | Discharge: 2020-10-21 | Disposition: A | Discharge status: Home / Self Care | Payer: Medicare PPO | Source: Ambulatory Visit | Attending: Family Medicine | Admitting: Family Medicine

## 2020-10-21 ENCOUNTER — Other Ambulatory Visit: Payer: Self-pay

## 2020-10-21 DIAGNOSIS — Z1231 Encounter for screening mammogram for malignant neoplasm of breast: Secondary | ICD-10-CM

## 2021-01-12 ENCOUNTER — Encounter: Payer: Medicare PPO | Admitting: Obstetrics and Gynecology

## 2021-01-13 NOTE — Progress Notes (Signed)
70 y.o. G1P1 Divorced [place] or [place] American female here for breast & pelvic.     Menopause at age 55 Denies vaginal bleeding Bowel and bladder functioning well.  Retired with [employer], worked as school principal and Animator  Patient's last menstrual period was 05/07/2004.          Sexually active: Yes.    The current method of family planning is post menopausal status.    Exercising: Yes.    3-4 times a week ymca Smoker:  no  Health Maintenance: Pap:  01-05-2019 neg History of abnormal Pap:  no MMG:  10-21-2020 category c density birads 1:neg Colonoscopy:  2017 BMD:   12-30-16 f/u 13yrs TDaP:  2021 Gardasil:   n/a Covid-19: moderna Hep C testing: neg per patient    reports that she has never smoked. She has never used smokeless tobacco. She reports current alcohol use. She reports that she does not use drugs.  Past Medical History:  Diagnosis Date  . Hypertension     Past Surgical History:  Procedure Laterality Date  . BREAST CYST EXCISION Right   . BREAST SURGERY  2002   benign cyst rt. breast No visible scar   . TUBAL LIGATION      Current Outpatient Medications  Medication Sig Dispense Refill  . Cyanocobalamin (VITAMIN B 12 PO) Take 1,000 mcg by mouth as needed.    . DHA-EPA-Vitamin E (OMEGA-3 COMPLEX PO) Take 1,600 mg by mouth.    . Fluocinolone Acetonide Scalp 0.01 % OIL APPLY 1 ML TO SCALP DAILY AS DIRECTED.  0  . lisinopril-hydrochlorothiazide (ZESTORETIC) 20-12.5 MG tablet Take 1 tablet by mouth daily.    . Multiple Vitamins-Minerals (MULTIVITAMIN ADULT PO) Take by mouth. Gummies with Biotin    . potassium chloride (K-DUR,KLOR-CON) 10 MEQ tablet Take 10 mEq by mouth 2 (two) times daily.    Marland Kitchen VITAMIN D PO Take by mouth.     Current Facility-Administered Medications  Medication Dose Route Frequency Provider Last Rate Last Admin  . 0.9 %  sodium chloride infusion  500 mL Intravenous Continuous Sherrilyn Rist, MD        Family History   Problem Relation Age of Onset  . Cancer Father        prostate    Review of Systems  Exam:   BP 110/72   Pulse 70   Resp 16   Ht 5' 3.25" (1.607 m)   Wt 141 lb (64 kg)   LMP 05/07/2004   BMI 24.78 kg/m   Height: 5' 3.25" (160.7 cm)  General appearance: alert, cooperative and appears stated age, no acute distress Head: Normocephalic, without obvious abnormality Neck: no adenopathy, thyroid normal to inspection and palpation Lungs: clear to auscultation bilaterally Breasts: Right breast palpates normal, Left breast normal but clustered thick tissue noted in upper outer quadrant 1-2 o'clock Heart: regular rate and rhythm Abdomen: soft, non-tender; no masses,  no organomegaly Extremities: extremities normal, no edema Skin: No rashes or lesions Lymph nodes: Cervical, supraclavicular, and axillary nodes normal. No abnormal inguinal nodes palpated Neurologic: Grossly normal   Pelvic: External genitalia:  no lesions              Urethra:  normal appearing urethra with no masses, tenderness or lesions              Bartholins and Skenes: normal                 Vagina: normal appearing vagina,  appropriate for age, normal appearing discharge, no lesion, mild cystocele, mild rectocele              Cervix: neg cervical motion tenderness, no visible lesions             Bimanual Exam:   Uterus:  normal size, contour, position, consistency, mobility, non-tender and prolapsed first degree              Adnexa: no mass, fullness, tenderness                  A:  Well Woman with normal exam  Dense breast  Mild pelvic organ prolapse/cystocele, rectocele  P:   Pap : due co-testing due 2023  Dexa: due 2023  Mammogram: due 10/2021  Labs: with PCP  Medications: no new    **f/u 6 weeks to recheck left breast. Recently had normal mammogram. Palpable dense tissue cluster upper outer quadrant. Will consider breast US in 6 weeks if persistent

## 2021-01-15 ENCOUNTER — Ambulatory Visit (INDEPENDENT_AMBULATORY_CARE_PROVIDER_SITE_OTHER): Payer: Medicare PPO | Admitting: Nurse Practitioner

## 2021-01-15 ENCOUNTER — Encounter: Payer: Self-pay | Admitting: Nurse Practitioner

## 2021-01-15 ENCOUNTER — Other Ambulatory Visit: Payer: Self-pay

## 2021-01-15 VITALS — BP 110/72 | HR 70 | Resp 16 | Ht 63.25 in | Wt 141.0 lb

## 2021-01-15 DIAGNOSIS — R922 Inconclusive mammogram: Secondary | ICD-10-CM | POA: Diagnosis not present

## 2021-01-15 DIAGNOSIS — Z01419 Encounter for gynecological examination (general) (routine) without abnormal findings: Secondary | ICD-10-CM | POA: Diagnosis not present

## 2021-01-15 NOTE — Patient Instructions (Signed)
Pelvic Organ Prolapse Pelvic organ prolapse is a condition in women that involves the stretching, bulging, or dropping of pelvic organs into an abnormal position, past the opening of the vagina. It happens when the muscles and tissues that surround and support pelvic structures become weak or stretched. Pelvic organ prolapse can involve the:  Vagina (vaginal prolapse).  Uterus (uterine prolapse).  Bladder (cystocele).  Rectum (rectocele).  Intestines (enterocele). When organs other than the vagina are involved, they often bulge into the vagina or protrude from the vagina, depending on how severe the prolapse is. What are the causes? This condition may be caused by:  Pregnancy, labor, and childbirth.  Past pelvic surgery.  Lower levels of the hormone estrogen due to menopause.  Consistently lifting more than 50 lb (23 kg).  Obesity.  Long-term difficulty passing stool (chronic constipation).  Long-term, or chronic, cough.  Fluid buildup in the abdomen due to certain conditions. What are the signs or symptoms? Symptoms of this condition include:  Leaking a little urine (loss of bladder control) when you cough, sneeze, strain, and exercise (stress incontinence). This may be worse immediately after childbirth. It may gradually improve over time.  Feeling pressure in your pelvis or vagina. This pressure may increase when you cough or when you are passing stool.  A bulge that protrudes from the opening of your vagina.  Difficulty passing urine or stool.  Pain in your lower back.  Pain or discomfort during sex, or decreased interest in sex.  Repeated bladder infections (urinary tract infections).  Difficulty inserting a tampon. In some people, this condition causes no symptoms. How is this diagnosed? This condition may be diagnosed based on a vaginal and rectal exam. During the exam, you may be asked to cough and strain while you are lying down, sitting, and standing up.  Your health care provider will determine if other tests are required, such as bladder function tests. How is this treated? Treatment for this condition may depend on your symptoms. Treatment may include:  Lifestyle changes, such as drinking plenty of fluids and eating foods that are high in fiber.  Emptying your bladder at scheduled times (bladder training therapy). This can help reduce or avoid urinary incontinence.  Estrogen. This may help mild prolapse by increasing the strength and tone of pelvic floor muscles.  Kegel exercises. These may help mild cases of prolapse by strengthening and tightening the muscles of the pelvic floor.  A soft, flexible device that helps support the vaginal walls and keep pelvic organs in place (pessary). This is inserted into your vagina by your health care provider.  Surgery. This is often the only form of treatment for severe prolapse. Follow these instructions at home: Eating and drinking  Avoid drinking beverages that contain caffeine or alcohol.  Increase your intake of high-fiber foods to decrease constipation and straining during bowel movements. Activity  Lose weight if recommended by your health care provider.  Avoid heavy lifting and straining with exercise and work. Do not hold your breath when you perform mild to moderate lifting and exercise activities. Limit your activities as directed by your health care provider.  Do Kegel exercises as directed by your health care provider. To do this: ? Squeeze your pelvic floor muscles tight. You should feel a tight lift in your rectal area and a tightness in your vaginal area. Keep your stomach, buttocks, and legs relaxed. ? Hold the muscles tight for up to 10 seconds. Then relax your muscles. ? Repeat this exercise   50 times a day, or as much as told by your health care provider. Continue to do this exercise for at least 4-6 weeks, or for as long as told by your health care provider. General  instructions  Take over-the-counter and prescription medicines only as told by your health care provider.  Wear a sanitary pad or adult diapers if you have urinary incontinence.  If you have a pessary, take care of it as told by your health care provider.  Keep all follow-up visits. This is important. Contact a health care provider if you:  Have symptoms that interfere with your daily activities or sex life.  Need medicine to help with the discomfort.  Notice bleeding from your vagina that is not related to your menstrual period.  Have a fever.  Have pain or bleeding when you urinate.  Have bleeding when you pass stool.  Pass urine when you have sex.  Have chronic constipation.  Have a pessary that falls out.  Have a foul-smelling vaginal discharge.  Have an unusual, low pain in your abdomen. Get help right away if you:  Cannot pass urine. Summary  Pelvic organ prolapse is the stretching, bulging, or dropping of pelvic organs into an abnormal position. It happens when the muscles and tissues that surround and support pelvic structures become weak or stretched.  When organs other than the vagina are involved, they often bulge into the vagina or protrude from it, depending on how severe the prolapse is.  In most cases, this condition needs to be treated only if it produces symptoms. Treatment may include lifestyle changes, estrogen, Kegel exercises, pessary insertion, or surgery.  Avoid heavy lifting and straining with exercise and work. Do not hold your breath when you perform mild to moderate lifting and exercise activities. Limit your activities as directed by your health care provider. This information is not intended to replace advice given to you by your health care provider. Make sure you discuss any questions you have with your health care provider. Document Revised: 04/21/2020 Document Reviewed: 04/21/2020 Elsevier Patient Education  2021 Elsevier Inc. Health  Maintenance After Age 71 After age 82, you are at a higher risk for certain long-term diseases and infections as well as injuries from falls. Falls are a major cause of broken bones and head injuries in people who are older than age 45. Getting regular preventive care can help to keep you healthy and well. Preventive care includes getting regular testing and making lifestyle changes as recommended by your health care provider. Talk with your health care provider about:  Which screenings and tests you should have. A screening is a test that checks for a disease when you have no symptoms.  A diet and exercise plan that is right for you. What should I know about screenings and tests to prevent falls? Screening and testing are the best ways to find a health problem early. Early diagnosis and treatment give you the best chance of managing medical conditions that are common after age 16. Certain conditions and lifestyle choices may make you more likely to have a fall. Your health care provider may recommend:  Regular vision checks. Poor vision and conditions such as cataracts can make you more likely to have a fall. If you wear glasses, make sure to get your prescription updated if your vision changes.  Medicine review. Work with your health care provider to regularly review all of the medicines you are taking, including over-the-counter medicines. Ask your health care provider about  any side effects that may make you more likely to have a fall. Tell your health care provider if any medicines that you take make you feel dizzy or sleepy.  Osteoporosis screening. Osteoporosis is a condition that causes the bones to get weaker. This can make the bones weak and cause them to break more easily.  Blood pressure screening. Blood pressure changes and medicines to control blood pressure can make you feel dizzy.  Strength and balance checks. Your health care provider may recommend certain tests to check your  strength and balance while standing, walking, or changing positions.  Foot health exam. Foot pain and numbness, as well as not wearing proper footwear, can make you more likely to have a fall.  Depression screening. You may be more likely to have a fall if you have a fear of falling, feel emotionally low, or feel unable to do activities that you used to do.  Alcohol use screening. Using too much alcohol can affect your balance and may make you more likely to have a fall. What actions can I take to lower my risk of falls? General instructions  Talk with your health care provider about your risks for falling. Tell your health care provider if: ? You fall. Be sure to tell your health care provider about all falls, even ones that seem minor. ? You feel dizzy, sleepy, or off-balance.  Take over-the-counter and prescription medicines only as told by your health care provider. These include any supplements.  Eat a healthy diet and maintain a healthy weight. A healthy diet includes low-fat dairy products, low-fat (lean) meats, and fiber from whole grains, beans, and lots of fruits and vegetables. Home safety  Remove any tripping hazards, such as rugs, cords, and clutter.  Install safety equipment such as grab bars in bathrooms and safety rails on stairs.  Keep rooms and walkways well-lit. Activity  Follow a regular exercise program to stay fit. This will help you maintain your balance. Ask your health care provider what types of exercise are appropriate for you.  If you need a cane or walker, use it as recommended by your health care provider.  Wear supportive shoes that have nonskid soles.   Lifestyle  Do not drink alcohol if your health care provider tells you not to drink.  If you drink alcohol, limit how much you have: ? 0-1 drink a day for women. ? 0-2 drinks a day for men.  Be aware of how much alcohol is in your drink. In the [place] one drink equals one typical bottle of beer (12  oz), one-half glass of wine (5 oz), or one shot of hard liquor (1 oz).  Do not use any products that contain nicotine or tobacco, such as cigarettes and e-cigarettes. If you need help quitting, ask your health care provider. Summary  Having a healthy lifestyle and getting preventive care can help to protect your health and wellness after age 53.  Screening and testing are the best way to find a health problem early and help you avoid having a fall. Early diagnosis and treatment give you the best chance for managing medical conditions that are more common for people who are older than age 65.  Falls are a major cause of broken bones and head injuries in people who are older than age 69. Take precautions to prevent a fall at home.  Work with your health care provider to learn what changes you can make to improve your health and wellness and to  prevent falls. This information is not intended to replace advice given to you by your health care provider. Make sure you discuss any questions you have with your health care provider. Document Revised: 02/15/2019 Document Reviewed: 09/07/2017 Elsevier Patient Education  2021 [employer].

## 2021-01-23 ENCOUNTER — Ambulatory Visit: Payer: Medicare PPO | Admitting: Nurse Practitioner

## 2021-02-19 NOTE — Progress Notes (Signed)
GYNECOLOGY  VISIT  CC:   Recheck breast  HPI: 70 y.o. G1P1 Divorced [place] or [place] American female here for left breast recheck.     GYNECOLOGIC HISTORY: Patient's last menstrual period was 05/07/2004. Contraception: post menopausal Menopausal hormone therapy: none  There are no problems to display for this patient.   Past Medical History:  Diagnosis Date  . Hypertension     Past Surgical History:  Procedure Laterality Date  . BREAST CYST EXCISION Right   . BREAST SURGERY  2002   benign cyst rt. breast No visible scar   . TUBAL LIGATION      MEDS:   Current Outpatient Medications on File Prior to Visit  Medication Sig Dispense Refill  . Cyanocobalamin (VITAMIN B 12 PO) Take 1,000 mcg by mouth as needed.    . DHA-EPA-Vitamin E (OMEGA-3 COMPLEX PO) Take 1,600 mg by mouth.    . Fluocinolone Acetonide Scalp 0.01 % OIL APPLY 1 ML TO SCALP DAILY AS DIRECTED.  0  . lisinopril-hydrochlorothiazide (ZESTORETIC) 20-12.5 MG tablet Take 1 tablet by mouth daily.    . Multiple Vitamins-Minerals (MULTIVITAMIN ADULT PO) Take by mouth. Gummies with Biotin    . potassium chloride (K-DUR,KLOR-CON) 10 MEQ tablet Take 10 mEq by mouth 2 (two) times daily.    Marland Kitchen VITAMIN D PO Take by mouth.     Current Facility-Administered Medications on File Prior to Visit  Medication Dose Route Frequency Provider Last Rate Last Admin  . 0.9 %  sodium chloride infusion  500 mL Intravenous Continuous Danis, Starr Lake III, MD        ALLERGIES: Amoxicillin  Family History  Problem Relation Age of Onset  . Cancer Father        prostate     Review of Systems  Constitutional: Negative.   HENT: Negative.   Eyes: Negative.   Respiratory: Negative.   Cardiovascular: Negative.   Gastrointestinal: Negative.   Endocrine: Negative.   Genitourinary: Negative.   Musculoskeletal: Negative.   Skin: Negative.   Allergic/Immunologic: Negative.   Neurological: Negative.   Hematological: Negative.    Psychiatric/Behavioral: Negative.     PHYSICAL EXAMINATION:    BP 114/70   Pulse 68   Resp 16   Wt 134 lb (60.8 kg)   LMP 05/07/2004   BMI 23.55 kg/m     General appearance: alert, cooperative, no acute distress Breast: Inspected in 3 positions: normal sitting position with hands at sides, arms up over head, hands on hips. No retractions, deviation of nipple or any other visible abnormality. (except Right breast scar from previous cyst)  Right breast normal Left breast-still palpable density, clustered area that palpates different from the rest of her breast tissue, upper outer quadrant 1-2 o'clock, 2 cm from areola  Assessment: Breast density  Plan: Schedule Left breast Ultrasound   15 minutes of total time was spent for this patient encounter, including preparation, face-to-face counseling with the patient and coordination of care, and documentation of the encounter.

## 2021-02-24 ENCOUNTER — Other Ambulatory Visit: Payer: Self-pay | Admitting: Nurse Practitioner

## 2021-02-24 ENCOUNTER — Encounter: Payer: Self-pay | Admitting: Nurse Practitioner

## 2021-02-24 ENCOUNTER — Telehealth: Payer: Self-pay | Admitting: *Deleted

## 2021-02-24 ENCOUNTER — Other Ambulatory Visit: Payer: Self-pay

## 2021-02-24 ENCOUNTER — Ambulatory Visit: Payer: Medicare PPO | Admitting: Nurse Practitioner

## 2021-02-24 VITALS — BP 114/70 | HR 68 | Resp 16 | Wt 134.0 lb

## 2021-02-24 DIAGNOSIS — R922 Inconclusive mammogram: Secondary | ICD-10-CM

## 2021-02-24 NOTE — Telephone Encounter (Signed)
RN spoke with Lauren Williams at [facility]. Patient scheduled for breast imaging on 03-30-21 at 1400.   Call to patient. Message given to patient as seen above. Patient unsure if that date and time will work for her, but will call [facility] directly if she needs to reschedule.   Encounter closed.

## 2021-02-24 NOTE — Telephone Encounter (Signed)
-----   Message from Clarita Crane, NP sent at 02/24/2021  9:21 AM EDT ----- The patient goes to [facility] [place]. Her last mammogram was normal in December. She was in for a screening exam in March 2022 and felt a cluster of dense tissue in left breast. Today she came for re-ck, I feel the same thing.  Please schedule left breast Ultrasound to evaluate upper outer quadrant density, persistent x 6 weeks.

## 2021-02-24 NOTE — Patient Instructions (Signed)

## 2021-03-30 ENCOUNTER — Other Ambulatory Visit: Payer: Medicare PPO

## 2021-04-02 ENCOUNTER — Ambulatory Visit
Admission: RE | Admit: 2021-04-02 | Discharge: 2021-04-02 | Disposition: A | Discharge status: Home / Self Care | Payer: Medicare PPO | Source: Ambulatory Visit | Attending: Nurse Practitioner | Admitting: Nurse Practitioner

## 2021-04-02 ENCOUNTER — Other Ambulatory Visit: Payer: Self-pay

## 2021-04-02 DIAGNOSIS — R922 Inconclusive mammogram: Secondary | ICD-10-CM | POA: Diagnosis not present

## 2021-04-02 DIAGNOSIS — N6489 Other specified disorders of breast: Secondary | ICD-10-CM | POA: Diagnosis not present

## 2021-05-04 DIAGNOSIS — E46 Unspecified protein-calorie malnutrition: Secondary | ICD-10-CM | POA: Diagnosis not present

## 2021-05-04 DIAGNOSIS — I10 Essential (primary) hypertension: Secondary | ICD-10-CM | POA: Diagnosis not present

## 2021-05-04 DIAGNOSIS — Z3042 Encounter for surveillance of injectable contraceptive: Secondary | ICD-10-CM | POA: Diagnosis not present

## 2021-05-04 DIAGNOSIS — E7800 Pure hypercholesterolemia, unspecified: Secondary | ICD-10-CM | POA: Diagnosis not present

## 2021-05-04 DIAGNOSIS — Z0000 Encounter for general adult medical examination without abnormal findings: Secondary | ICD-10-CM | POA: Diagnosis not present

## 2021-05-04 DIAGNOSIS — Z1389 Encounter for screening for other disorder: Secondary | ICD-10-CM | POA: Diagnosis not present

## 2021-06-02 DIAGNOSIS — H524 Presbyopia: Secondary | ICD-10-CM | POA: Diagnosis not present

## 2021-06-02 DIAGNOSIS — H2513 Age-related nuclear cataract, bilateral: Secondary | ICD-10-CM | POA: Diagnosis not present

## 2021-08-13 DIAGNOSIS — Z809 Family history of malignant neoplasm, unspecified: Secondary | ICD-10-CM | POA: Diagnosis not present

## 2021-08-13 DIAGNOSIS — I10 Essential (primary) hypertension: Secondary | ICD-10-CM | POA: Diagnosis not present

## 2021-08-13 DIAGNOSIS — Z8249 Family history of ischemic heart disease and other diseases of the circulatory system: Secondary | ICD-10-CM | POA: Diagnosis not present

## 2021-09-07 ENCOUNTER — Other Ambulatory Visit: Payer: Self-pay | Admitting: Family Medicine

## 2021-09-07 DIAGNOSIS — Z1231 Encounter for screening mammogram for malignant neoplasm of breast: Secondary | ICD-10-CM

## 2021-10-23 ENCOUNTER — Ambulatory Visit: Payer: Medicare PPO

## 2021-11-20 ENCOUNTER — Ambulatory Visit
Admission: RE | Admit: 2021-11-20 | Discharge: 2021-11-20 | Disposition: A | Discharge status: Home / Self Care | Payer: Medicare PPO | Source: Ambulatory Visit | Attending: Family Medicine | Admitting: Family Medicine

## 2021-11-20 DIAGNOSIS — Z1231 Encounter for screening mammogram for malignant neoplasm of breast: Secondary | ICD-10-CM

## 2022-01-18 NOTE — Progress Notes (Unsigned)
° °  Lauren Williams [birth year] [id number]   History:  71 y.o. G1P1 presents for breast and pelvic exam. Postmenopausal - no HRT, no bleeding. Normal pap history. HTN managed by PCP.   Gynecologic History Patient's last menstrual period was 05/07/2004.   Contraception: post menopausal status Sexually active: ***  Health Maintenance Last Pap: 01/05/2019. Results were: Normal Last mammogram: 11/20/2021. Results were: Normal Last colonoscopy: 09/24/2016. Results were: Normal, 10-year repeat  Last Dexa: 12/30/2016. Results were: Normal  Past medical history, past surgical history, family history and social history were all reviewed and documented in the EPIC chart.  ROS:  A ROS was performed and pertinent positives and negatives are included.  Exam:  There were no vitals filed for this visit. There is no height or weight on file to calculate BMI.  General appearance:  Normal Thyroid:  Symmetrical, normal in size, without palpable masses or nodularity. Respiratory  Auscultation:  Clear without wheezing or rhonchi Cardiovascular  Auscultation:  Regular rate, without rubs, murmurs or gallops  Edema/varicosities:  Not grossly evident Abdominal  Soft,nontender, without masses, guarding or rebound.  Liver/spleen:  No organomegaly noted  Hernia:  None appreciated  Skin  Inspection:  Grossly normal Breasts: Examined lying and sitting.   Right: Without masses, retractions, nipple discharge or axillary adenopathy.   Left: Without masses, retractions, nipple discharge or axillary adenopathy. Genitourinary   Inguinal/mons:  Normal without inguinal adenopathy  External genitalia:  Normal appearing vulva with no masses, tenderness, or lesions  BUS/Urethra/Skene's glands:  Normal  Vagina:  Normal appearing with normal color and discharge, no lesions  Cervix:  Normal appearing without discharge or lesions  Uterus:  Normal in size, shape and contour.  Midline and mobile,  nontender  Adnexa/parametria:     Rt: Normal in size, without masses or tenderness.   Lt: Normal in size, without masses or tenderness.  Anus and perineum: Normal  Digital rectal exam: Normal sphincter tone without palpated masses or tenderness  Patient informed chaperone available to be present for breast and pelvic exam. Patient has requested no chaperone to be present. Patient has been advised what will be completed during breast and pelvic exam.   Assessment/Plan:  71 y.o. G1P1 for breast and pelvic exam.   Return in 1 year for breast and pelvic exam.   Olivia Mackie DNP, 3:28 PM 01/18/2022

## 2022-01-19 ENCOUNTER — Ambulatory Visit: Payer: Medicare PPO | Admitting: Nurse Practitioner

## 2022-01-19 ENCOUNTER — Other Ambulatory Visit: Payer: Self-pay

## 2022-01-19 ENCOUNTER — Ambulatory Visit (INDEPENDENT_AMBULATORY_CARE_PROVIDER_SITE_OTHER): Payer: Medicare PPO | Admitting: Nurse Practitioner

## 2022-01-19 ENCOUNTER — Encounter: Payer: Self-pay | Admitting: Nurse Practitioner

## 2022-01-19 VITALS — BP 124/80 | Ht 63.0 in | Wt 126.0 lb

## 2022-01-19 DIAGNOSIS — Z780 Asymptomatic menopausal state: Secondary | ICD-10-CM

## 2022-01-19 DIAGNOSIS — Z01419 Encounter for gynecological examination (general) (routine) without abnormal findings: Secondary | ICD-10-CM

## 2022-03-03 ENCOUNTER — Ambulatory Visit (INDEPENDENT_AMBULATORY_CARE_PROVIDER_SITE_OTHER): Payer: Medicare PPO

## 2022-03-03 ENCOUNTER — Other Ambulatory Visit: Payer: Self-pay | Admitting: Nurse Practitioner

## 2022-03-03 DIAGNOSIS — Z13820 Encounter for screening for osteoporosis: Secondary | ICD-10-CM

## 2022-03-03 DIAGNOSIS — Z780 Asymptomatic menopausal state: Secondary | ICD-10-CM

## 2022-05-24 DIAGNOSIS — I10 Essential (primary) hypertension: Secondary | ICD-10-CM | POA: Diagnosis not present

## 2022-05-24 DIAGNOSIS — Z8249 Family history of ischemic heart disease and other diseases of the circulatory system: Secondary | ICD-10-CM | POA: Diagnosis not present

## 2022-05-24 DIAGNOSIS — Z809 Family history of malignant neoplasm, unspecified: Secondary | ICD-10-CM | POA: Diagnosis not present

## 2022-06-07 DIAGNOSIS — E876 Hypokalemia: Secondary | ICD-10-CM | POA: Diagnosis not present

## 2022-06-07 DIAGNOSIS — E7800 Pure hypercholesterolemia, unspecified: Secondary | ICD-10-CM | POA: Diagnosis not present

## 2022-06-07 DIAGNOSIS — Z0000 Encounter for general adult medical examination without abnormal findings: Secondary | ICD-10-CM | POA: Diagnosis not present

## 2022-06-07 DIAGNOSIS — I10 Essential (primary) hypertension: Secondary | ICD-10-CM | POA: Diagnosis not present

## 2022-06-16 DIAGNOSIS — D72819 Decreased white blood cell count, unspecified: Secondary | ICD-10-CM | POA: Diagnosis not present

## 2022-07-21 DIAGNOSIS — H524 Presbyopia: Secondary | ICD-10-CM | POA: Diagnosis not present

## 2022-07-21 DIAGNOSIS — H2513 Age-related nuclear cataract, bilateral: Secondary | ICD-10-CM | POA: Diagnosis not present

## 2022-10-14 ENCOUNTER — Other Ambulatory Visit: Payer: Self-pay | Admitting: Family Medicine

## 2022-10-14 DIAGNOSIS — Z1231 Encounter for screening mammogram for malignant neoplasm of breast: Secondary | ICD-10-CM

## 2022-12-09 ENCOUNTER — Ambulatory Visit
Admission: RE | Admit: 2022-12-09 | Discharge: 2022-12-09 | Disposition: A | Discharge status: Home / Self Care | Payer: Medicare PPO | Source: Ambulatory Visit | Attending: Family Medicine | Admitting: Family Medicine

## 2022-12-09 DIAGNOSIS — Z1231 Encounter for screening mammogram for malignant neoplasm of breast: Secondary | ICD-10-CM

## 2023-01-03 DIAGNOSIS — M65312 Trigger thumb, left thumb: Secondary | ICD-10-CM | POA: Diagnosis not present

## 2023-01-11 DIAGNOSIS — M65312 Trigger thumb, left thumb: Secondary | ICD-10-CM | POA: Diagnosis not present

## 2023-01-25 IMAGING — US US BREAST*L* LIMITED INC AXILLA
1 series · 5 of 5 positions shown · non-contrast
Comparison: Previous exam(s).

CLINICAL DATA: 69-year-old female presenting for evaluation of a
lump felt by the patient's provider in the upper outer left breast.

EXAM:
DIGITAL DIAGNOSTIC UNILATERAL LEFT MAMMOGRAM WITH TOMOSYNTHESIS AND
CAD; ULTRASOUND LEFT BREAST LIMITED
TECHNIQUE: Left digital diagnostic mammography and breast tomosynthesis was
performed. The images were evaluated with computer-aided detection.;
Targeted ultrasound examination of the left breast was performed

[Series 1: us breast*left* limited inc axilla · 0.06mm/px · 5 of 5 slices shown]
[im 1/5]
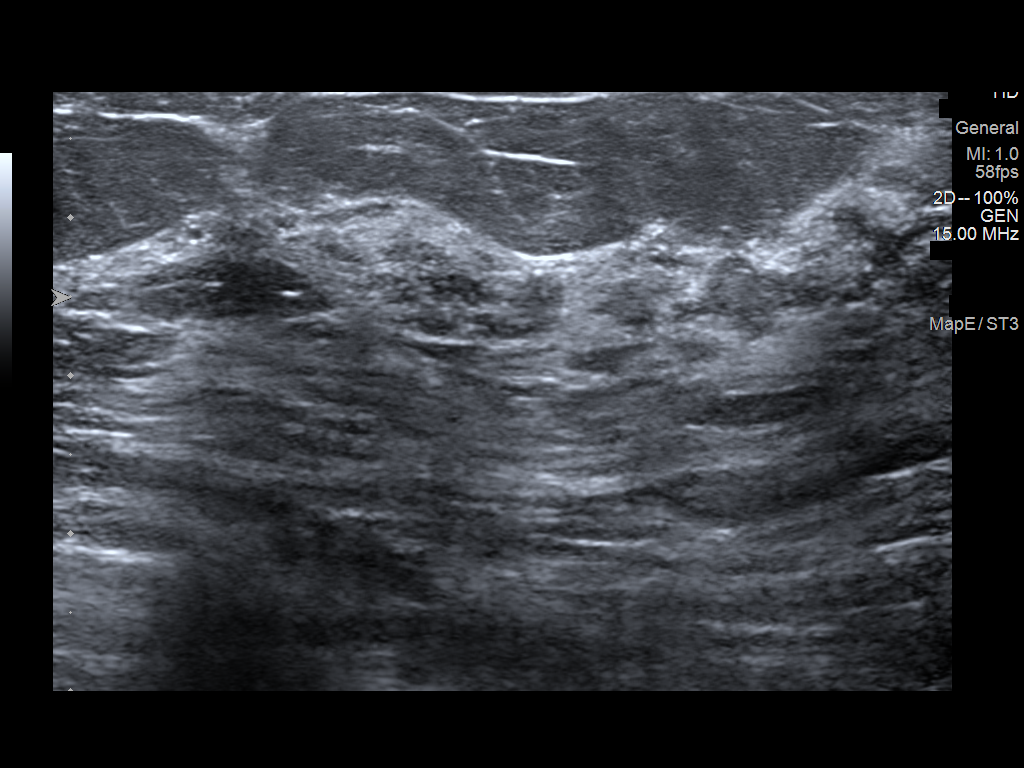
[im 2/5]
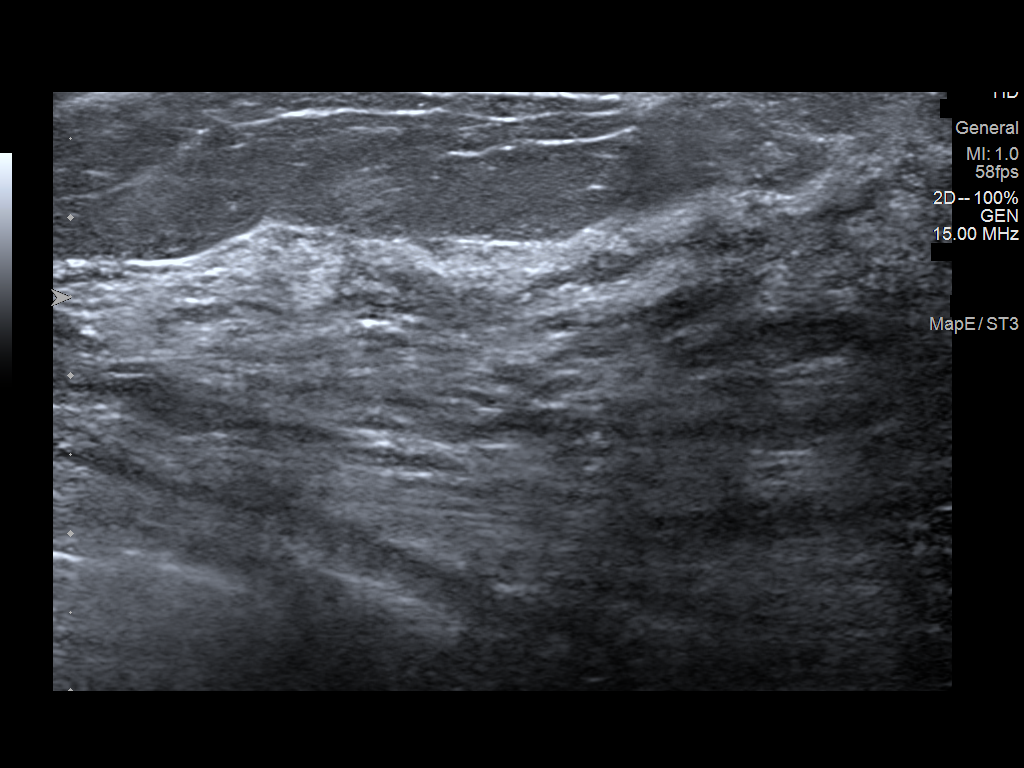
[im 3/5]
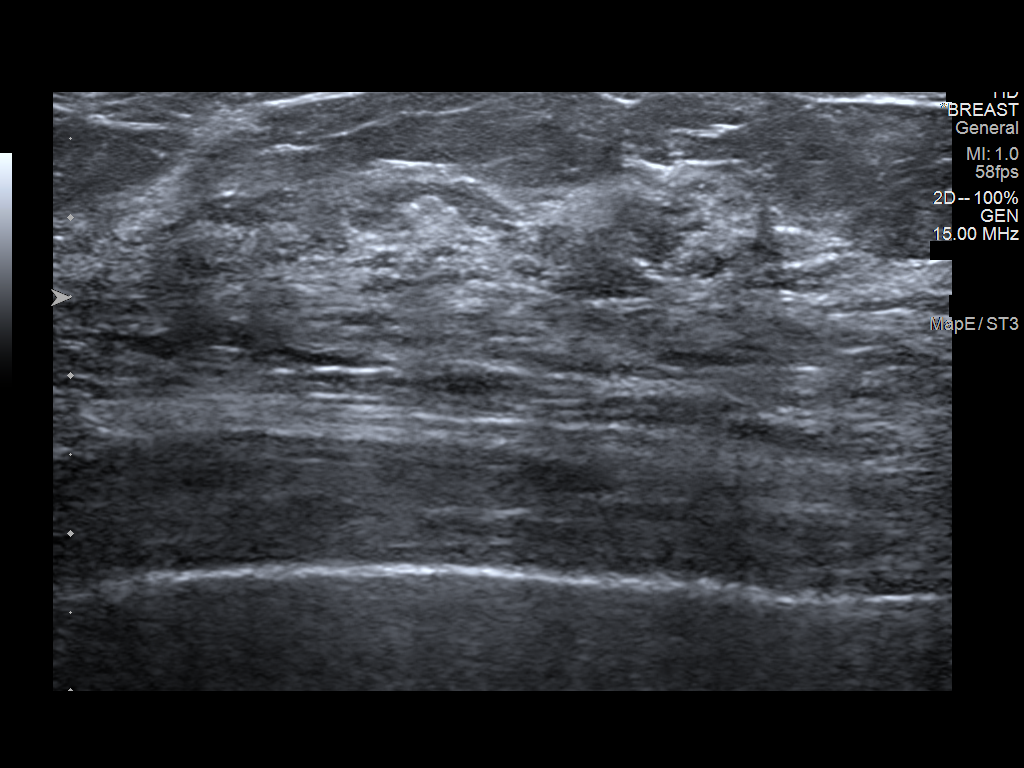
[im 4/5]
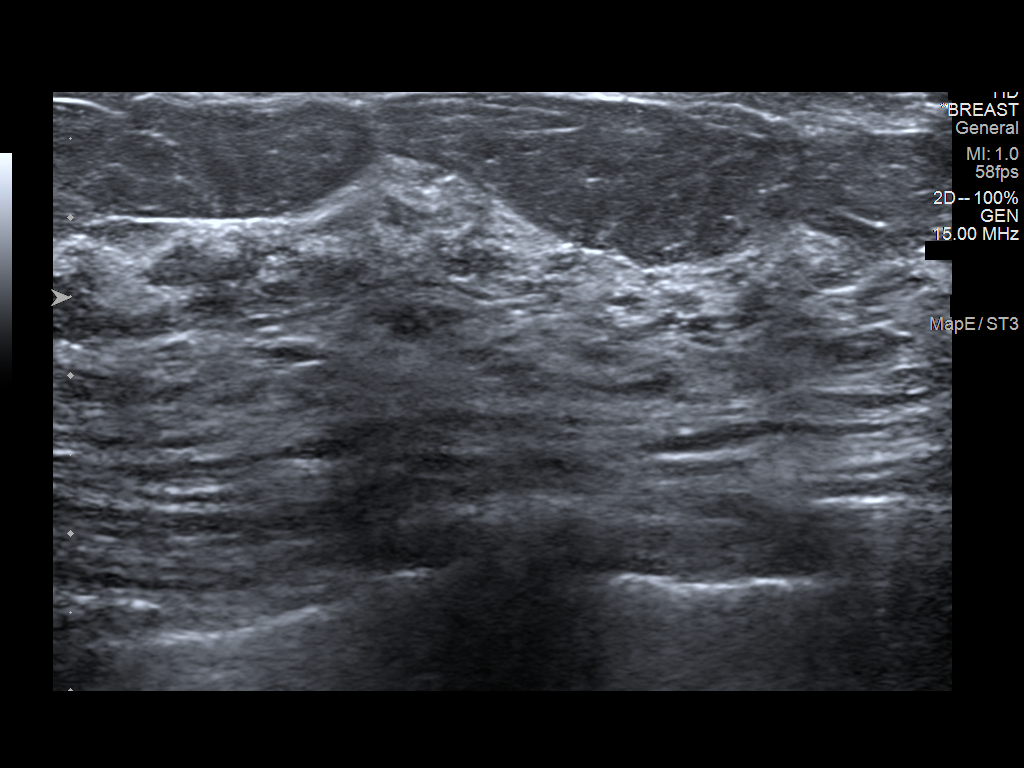
[im 5/5]
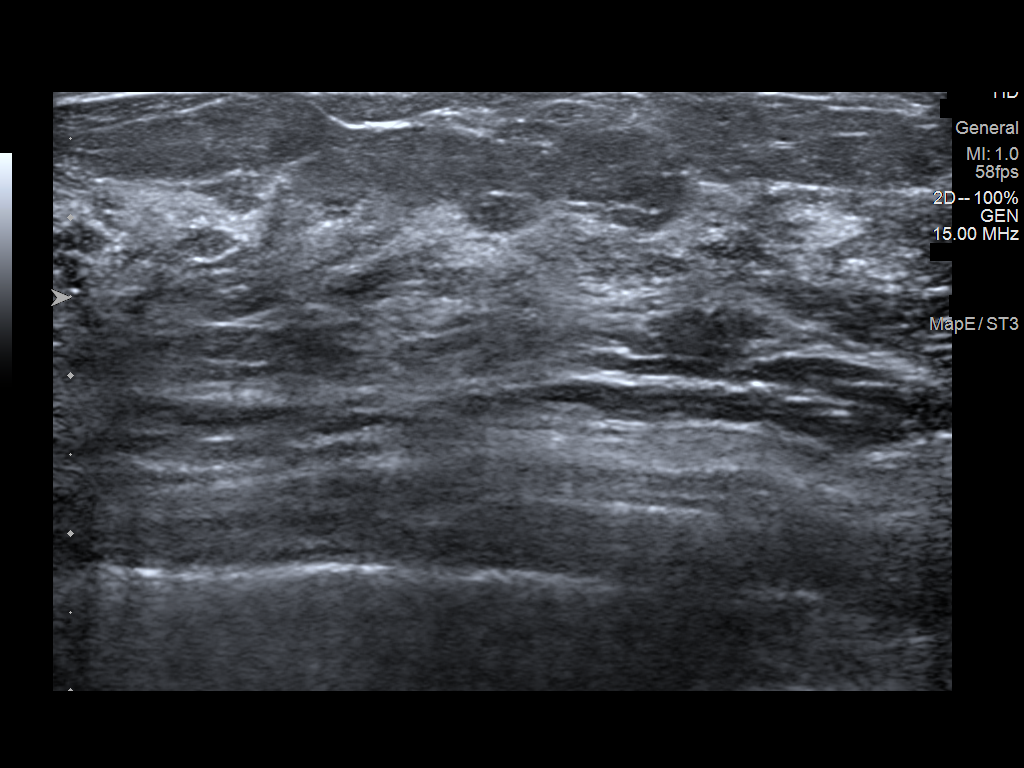

[5 of 5 positions shown; findings below may reference images not displayed]

ACR Breast Density Category c: The breast tissue is heterogeneously
dense, which may obscure small masses.
FINDINGS: Mammogram:

Full field tomosynthesis views of the left breast were performed.
There is no new abnormality in the upper-outer quadrant of the left
breast or elsewhere to suggest the presence of malignancy.

On physical exam of the upper-outer left breast I feel ridges of
tissue without a fixed discrete mass.

Ultrasound:

Targeted ultrasound performed throughout the upper-outer quadrant
the left breast demonstrating no cystic or solid mass.
IMPRESSION: No mammographic or sonographic evidence of malignancy in the
upper-outer quadrant of the left breast.

RECOMMENDATION:
1. Recommend any further workup of the palpable site in the left
breast be on a clinical basis.

2. Return for routine annual screening mammography which will be due
in October 2021.

I have discussed the findings and recommendations with the patient.
If applicable, a reminder letter will be sent to the patient
regarding the next appointment.

BI-RADS CATEGORY  1: Negative.

## 2023-06-10 DIAGNOSIS — I10 Essential (primary) hypertension: Secondary | ICD-10-CM | POA: Diagnosis not present

## 2023-06-10 DIAGNOSIS — Z23 Encounter for immunization: Secondary | ICD-10-CM | POA: Diagnosis not present

## 2023-06-10 DIAGNOSIS — E7800 Pure hypercholesterolemia, unspecified: Secondary | ICD-10-CM | POA: Diagnosis not present

## 2023-06-10 DIAGNOSIS — Z0000 Encounter for general adult medical examination without abnormal findings: Secondary | ICD-10-CM | POA: Diagnosis not present

## 2023-06-14 DIAGNOSIS — Z809 Family history of malignant neoplasm, unspecified: Secondary | ICD-10-CM | POA: Diagnosis not present

## 2023-06-14 DIAGNOSIS — E876 Hypokalemia: Secondary | ICD-10-CM | POA: Diagnosis not present

## 2023-06-14 DIAGNOSIS — I10 Essential (primary) hypertension: Secondary | ICD-10-CM | POA: Diagnosis not present

## 2023-09-14 DIAGNOSIS — M65312 Trigger thumb, left thumb: Secondary | ICD-10-CM | POA: Diagnosis not present

## 2023-09-14 IMAGING — MG MM DIGITAL SCREENING BILAT W/ TOMO AND CAD
8 series · 8 of 24 positions shown · non-contrast
Comparison: Previous exam(s).

CLINICAL DATA: Screening.

EXAM:
DIGITAL SCREENING BILATERAL MAMMOGRAM WITH TOMOSYNTHESIS AND CAD
TECHNIQUE: Bilateral screening digital craniocaudal and mediolateral oblique
mammograms were obtained. Bilateral screening digital breast
tomosynthesis was performed. The images were evaluated with
computer-aided detection.

[L CC synth-2D]
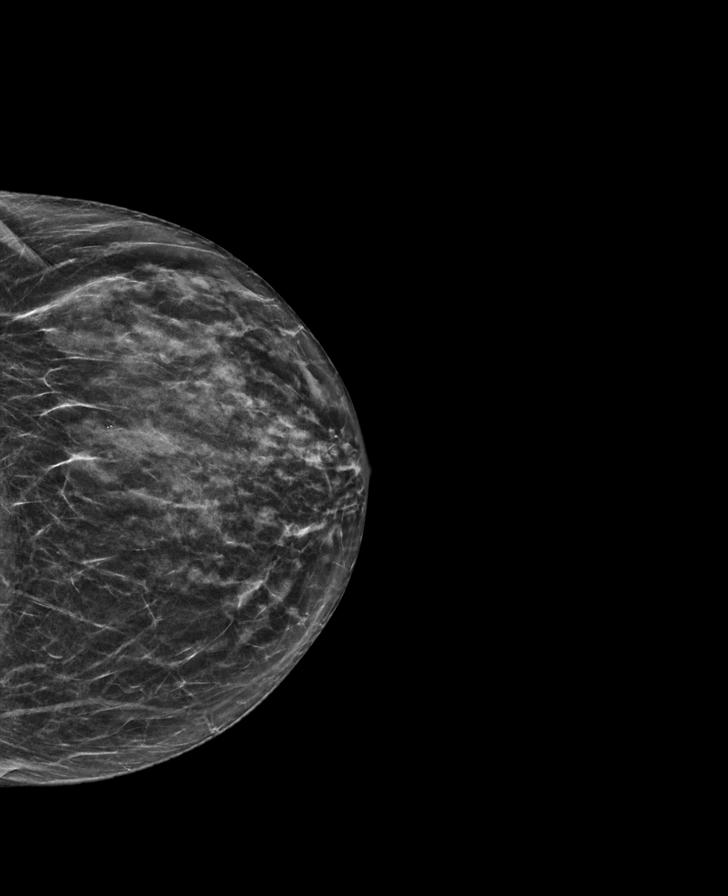

[L MLO synth-2D]
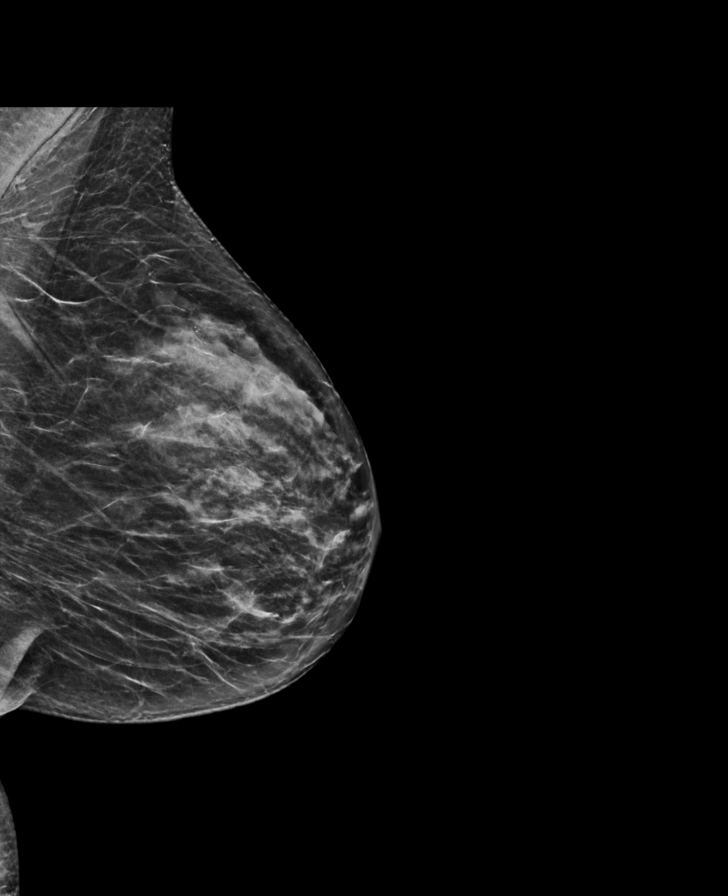

[R MLO synth-2D]
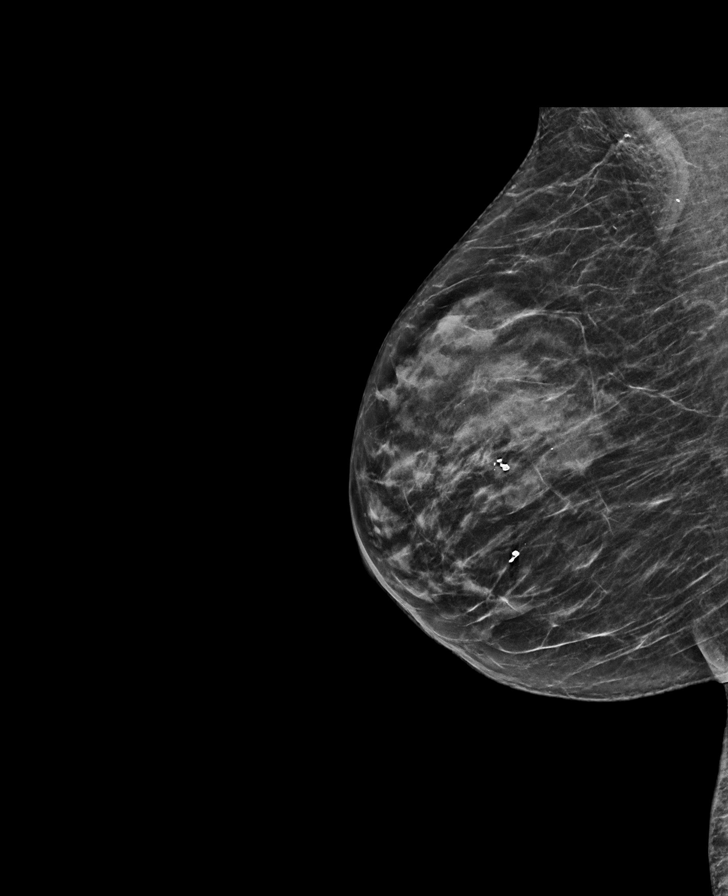

[R CC synth-2D]
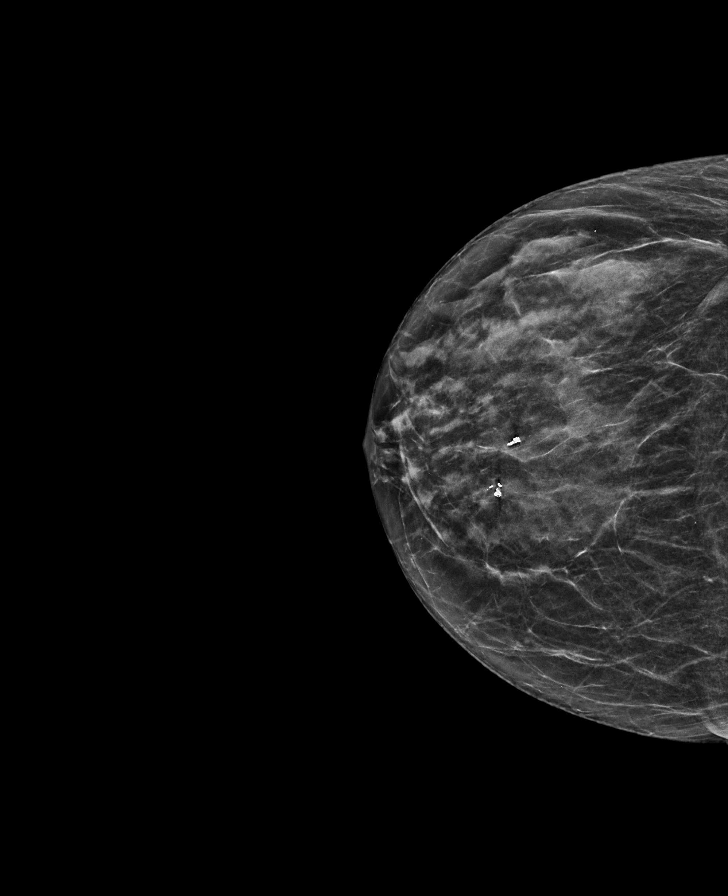

[R CC tomo · tomo slice 27/52.0]
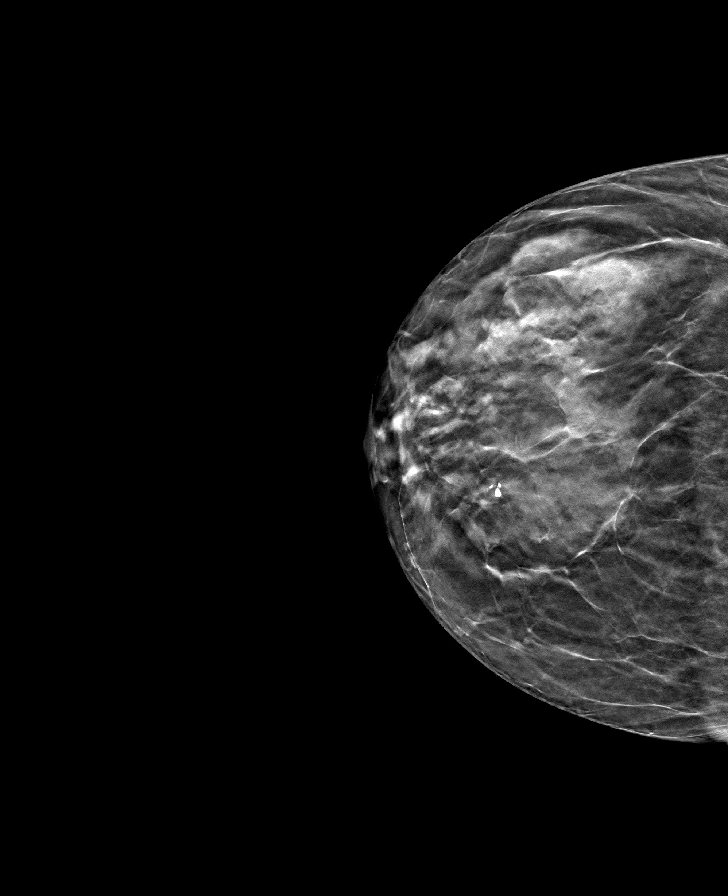

[R MLO tomo · tomo slice 28/55.0]
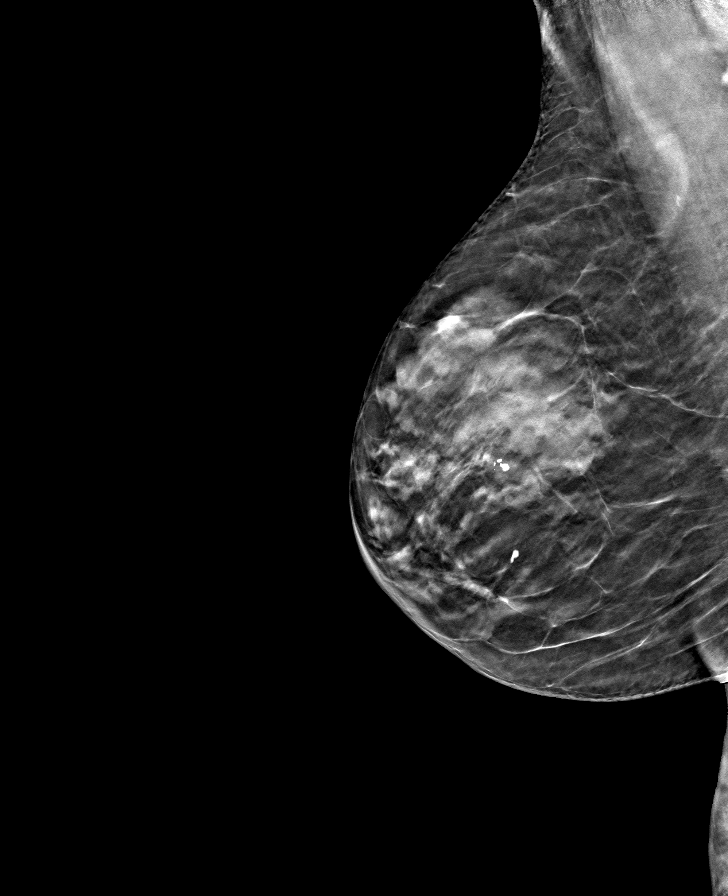

[L MLO tomo · tomo slice 30/59.0]
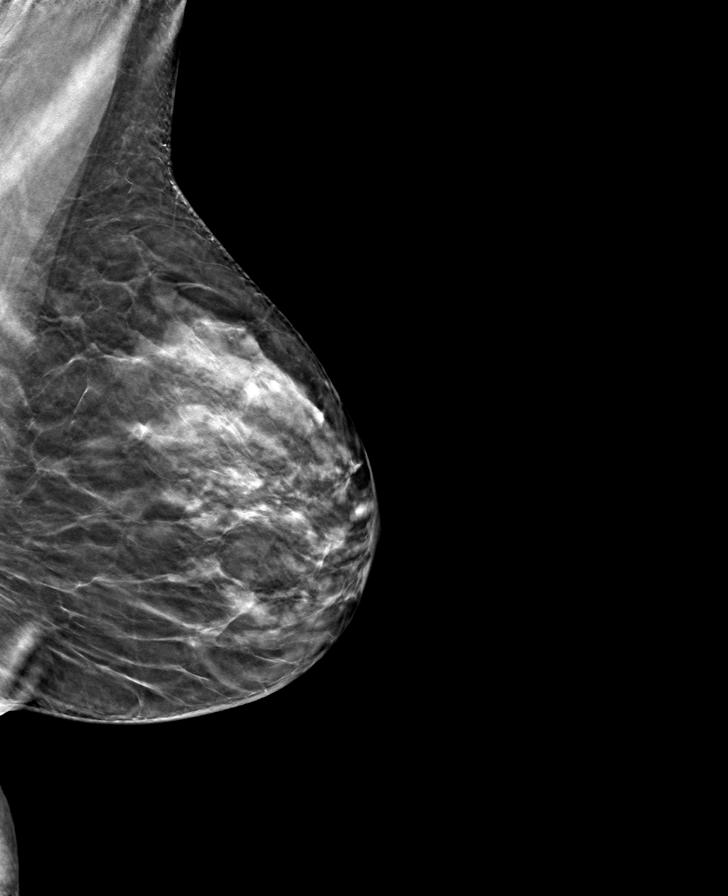

[L CC tomo · tomo slice 27/53.0]
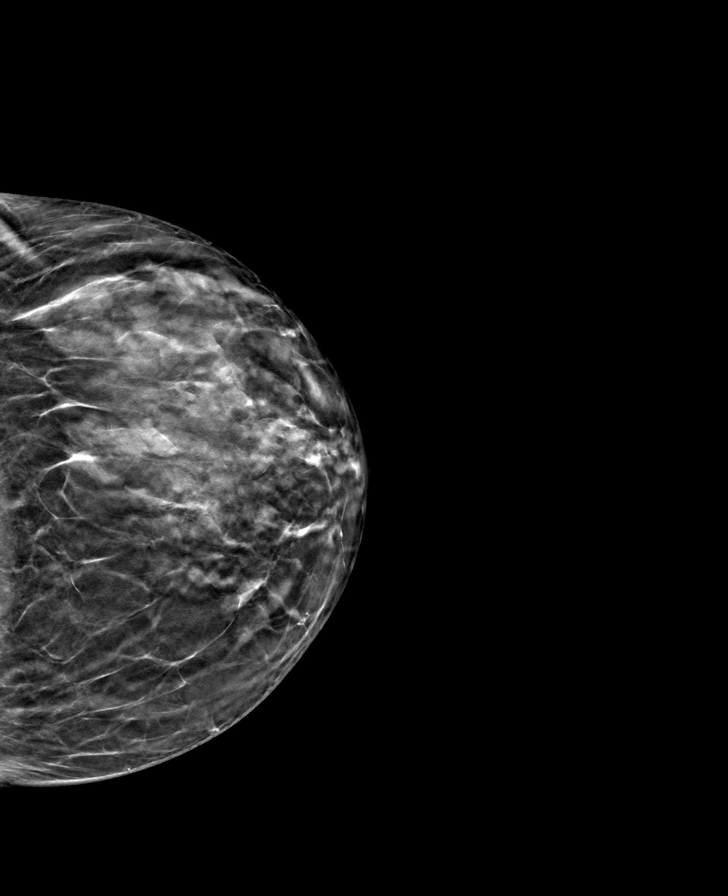

[8 of 24 positions shown; findings below may reference images not displayed]

ACR Breast Density Category c: The breast tissue is heterogeneously
dense, which may obscure small masses.
FINDINGS: There are no findings suspicious for malignancy.
IMPRESSION: No mammographic evidence of malignancy. A result letter of this
screening mammogram will be mailed directly to the patient.

RECOMMENDATION:
Screening mammogram in one year. (Code:Q3-W-BC3)

BI-RADS CATEGORY  1: Negative.

## 2023-10-20 DIAGNOSIS — H2513 Age-related nuclear cataract, bilateral: Secondary | ICD-10-CM | POA: Diagnosis not present

## 2023-10-20 DIAGNOSIS — H5203 Hypermetropia, bilateral: Secondary | ICD-10-CM | POA: Diagnosis not present

## 2023-10-20 DIAGNOSIS — H52223 Regular astigmatism, bilateral: Secondary | ICD-10-CM | POA: Diagnosis not present

## 2023-10-20 DIAGNOSIS — H524 Presbyopia: Secondary | ICD-10-CM | POA: Diagnosis not present

## 2023-11-11 ENCOUNTER — Emergency Department (HOSPITAL_COMMUNITY)
Admission: EM | Admit: 2023-11-11 | Discharge: 2023-11-11 | Disposition: A | Discharge status: Home / Self Care | Payer: Medicare PPO | Attending: Emergency Medicine | Admitting: Emergency Medicine

## 2023-11-11 ENCOUNTER — Emergency Department (HOSPITAL_COMMUNITY): Payer: Medicare PPO

## 2023-11-11 ENCOUNTER — Encounter (HOSPITAL_COMMUNITY): Payer: Self-pay

## 2023-11-11 ENCOUNTER — Other Ambulatory Visit: Payer: Self-pay

## 2023-11-11 DIAGNOSIS — Y92410 Unspecified street and highway as the place of occurrence of the external cause: Secondary | ICD-10-CM | POA: Insufficient documentation

## 2023-11-11 DIAGNOSIS — M549 Dorsalgia, unspecified: Secondary | ICD-10-CM | POA: Diagnosis not present

## 2023-11-11 DIAGNOSIS — S29012A Strain of muscle and tendon of back wall of thorax, initial encounter: Secondary | ICD-10-CM | POA: Insufficient documentation

## 2023-11-11 DIAGNOSIS — S29002A Unspecified injury of muscle and tendon of back wall of thorax, initial encounter: Secondary | ICD-10-CM | POA: Diagnosis present

## 2023-11-11 DIAGNOSIS — I10 Essential (primary) hypertension: Secondary | ICD-10-CM | POA: Insufficient documentation

## 2023-11-11 DIAGNOSIS — Z79899 Other long term (current) drug therapy: Secondary | ICD-10-CM | POA: Diagnosis not present

## 2023-11-11 MED ORDER — ACETAMINOPHEN 325 MG PO TABS
650.0000 mg | ORAL_TABLET | Freq: Once | ORAL | Status: AC
  Administered 2023-11-11: 650 mg via ORAL
  Filled 2023-11-11: qty 2

## 2023-11-11 NOTE — Discharge Instructions (Signed)
 Please use Tylenol or ibuprofen for pain.  You may use 600 mg ibuprofen every 6 hours or 1000 mg of Tylenol every 6 hours.  You may choose to alternate between the 2.  This would be most effective.  Not to exceed 4 g of Tylenol within 24 hours.  Not to exceed 3200 mg ibuprofen 24 hours.
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
## 2023-11-28 ENCOUNTER — Other Ambulatory Visit: Payer: Self-pay

## 2023-11-28 ENCOUNTER — Ambulatory Visit (INDEPENDENT_AMBULATORY_CARE_PROVIDER_SITE_OTHER): Payer: Medicare PPO | Admitting: Orthopaedic Surgery

## 2023-11-28 DIAGNOSIS — M546 Pain in thoracic spine: Secondary | ICD-10-CM | POA: Diagnosis not present

## 2023-11-28 MED ORDER — TIZANIDINE HCL 2 MG PO TABS: 2.0000 mg | ORAL_TABLET | Freq: Two times a day (BID) | ORAL | 0 refills | Status: AC | PRN
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
## 2023-11-29 ENCOUNTER — Other Ambulatory Visit: Payer: Self-pay | Admitting: Family Medicine

## 2023-11-29 DIAGNOSIS — Z1231 Encounter for screening mammogram for malignant neoplasm of breast: Secondary | ICD-10-CM

## 2023-12-12 ENCOUNTER — Encounter: Payer: Self-pay | Admitting: Physical Therapy

## 2023-12-12 ENCOUNTER — Ambulatory Visit: Payer: Medicare PPO | Admitting: Physical Therapy

## 2023-12-12 DIAGNOSIS — M546 Pain in thoracic spine: Secondary | ICD-10-CM

## 2023-12-12 DIAGNOSIS — M6281 Muscle weakness (generalized): Secondary | ICD-10-CM

## 2023-12-12 DIAGNOSIS — M542 Cervicalgia: Secondary | ICD-10-CM | POA: Diagnosis not present
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
## 2023-12-14 ENCOUNTER — Encounter: Payer: Self-pay | Admitting: Physical Therapy

## 2023-12-14 ENCOUNTER — Ambulatory Visit: Payer: Medicare PPO | Admitting: Physical Therapy

## 2023-12-14 ENCOUNTER — Ambulatory Visit
Admission: RE | Admit: 2023-12-14 | Discharge: 2023-12-14 | Disposition: A | Discharge status: Home / Self Care | Payer: Medicare PPO | Source: Ambulatory Visit | Attending: Family Medicine | Admitting: Family Medicine

## 2023-12-14 DIAGNOSIS — Z1231 Encounter for screening mammogram for malignant neoplasm of breast: Secondary | ICD-10-CM | POA: Diagnosis not present

## 2023-12-14 DIAGNOSIS — M6281 Muscle weakness (generalized): Secondary | ICD-10-CM

## 2023-12-14 DIAGNOSIS — M542 Cervicalgia: Secondary | ICD-10-CM

## 2023-12-14 DIAGNOSIS — M546 Pain in thoracic spine: Secondary | ICD-10-CM
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
## 2023-12-26 ENCOUNTER — Encounter: Payer: Self-pay | Admitting: Rehabilitative and Restorative Service Providers"

## 2023-12-26 ENCOUNTER — Encounter: Payer: Self-pay | Admitting: Physician Assistant

## 2023-12-26 ENCOUNTER — Ambulatory Visit: Payer: Medicare PPO | Admitting: Rehabilitative and Restorative Service Providers"

## 2023-12-26 ENCOUNTER — Ambulatory Visit: Payer: Medicare PPO | Admitting: Physician Assistant

## 2023-12-26 DIAGNOSIS — M546 Pain in thoracic spine: Secondary | ICD-10-CM

## 2023-12-26 DIAGNOSIS — M542 Cervicalgia: Secondary | ICD-10-CM

## 2023-12-26 DIAGNOSIS — M6281 Muscle weakness (generalized): Secondary | ICD-10-CM | POA: Diagnosis not present
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
## 2023-12-28 ENCOUNTER — Encounter: Payer: Medicare PPO | Admitting: Rehabilitative and Restorative Service Providers"

## 2024-01-02 ENCOUNTER — Encounter: Payer: Self-pay | Admitting: Physical Therapy

## 2024-01-02 ENCOUNTER — Ambulatory Visit: Payer: Medicare PPO | Admitting: Physical Therapy

## 2024-01-02 DIAGNOSIS — M542 Cervicalgia: Secondary | ICD-10-CM | POA: Diagnosis not present

## 2024-01-02 DIAGNOSIS — M6281 Muscle weakness (generalized): Secondary | ICD-10-CM

## 2024-01-02 DIAGNOSIS — M546 Pain in thoracic spine: Secondary | ICD-10-CM
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
## 2024-01-04 ENCOUNTER — Encounter: Payer: Self-pay | Admitting: Rehabilitative and Restorative Service Providers"

## 2024-01-04 ENCOUNTER — Ambulatory Visit: Payer: Medicare PPO | Admitting: Rehabilitative and Restorative Service Providers"

## 2024-01-04 DIAGNOSIS — M542 Cervicalgia: Secondary | ICD-10-CM

## 2024-01-04 DIAGNOSIS — M6281 Muscle weakness (generalized): Secondary | ICD-10-CM | POA: Diagnosis not present

## 2024-01-04 DIAGNOSIS — M546 Pain in thoracic spine: Secondary | ICD-10-CM | POA: Diagnosis not present
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
## 2024-01-09 ENCOUNTER — Encounter: Payer: Medicare PPO | Admitting: Physical Therapy

## 2024-01-11 ENCOUNTER — Encounter: Payer: Medicare PPO | Admitting: Physical Therapy

## 2024-04-10 ENCOUNTER — Ambulatory Visit (INDEPENDENT_AMBULATORY_CARE_PROVIDER_SITE_OTHER): Admitting: Nurse Practitioner

## 2024-04-10 ENCOUNTER — Encounter: Payer: Self-pay | Admitting: Nurse Practitioner

## 2024-04-10 VITALS — BP 122/68 | HR 72 | Ht 62.5 in | Wt 136.4 lb

## 2024-04-10 DIAGNOSIS — Z01419 Encounter for gynecological examination (general) (routine) without abnormal findings: Secondary | ICD-10-CM

## 2024-04-10 DIAGNOSIS — Z780 Asymptomatic menopausal state: Secondary | ICD-10-CM

## 2024-04-10 NOTE — Progress Notes (Signed)
   Lauren Williams 1951-09-08 [id number]   History:  73 y.o. G1P1 presents for breast and pelvic exam. Postmenopausal - no HRT, no bleeding. Normal pap history. HTN managed by PCP.   Gynecologic History Patient's last menstrual period was 05/07/2004.   Contraception: post menopausal status  Health Maintenance Last Pap: 01/05/2019. Results were: Normal Last mammogram: 12/14/2023. Results were: Normal Last colonoscopy: 09/24/2016. Results were: Normal, 10-year repeat  Last Dexa: 03/03/2022. Results were: Normal  Past medical history, past surgical history, family history and social history were all reviewed and documented in the EPIC chart. 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 Daughter 48 lives in [place]  with husband. Helps care for mother who is 21 years old.   ROS:  A ROS was performed and pertinent positives and negatives are included.  Exam:  Vitals:   04/10/24 1150  BP: 122/68  Pulse: 72  SpO2: 99%  Weight: 136 lb 6.4 oz (61.9 kg)  Height: 5' 2.5" (1.588 m)    Body mass index is 24.55 kg/m.  General appearance:  Normal Thyroid:  Symmetrical, normal in size, without palpable masses or nodularity. Respiratory  Auscultation:  Clear without wheezing or rhonchi Cardiovascular  Auscultation:  Regular rate, without rubs, murmurs or gallops  Edema/varicosities:  Not grossly evident Abdominal  Soft,nontender, without masses, guarding or rebound.  Liver/spleen:  No organomegaly noted  Hernia:  None appreciated  Skin  Inspection:  Grossly normal Breasts: Examined lying and sitting.   Right: Without masses, retractions, nipple discharge or axillary adenopathy.   Left: Without masses, retractions, nipple discharge or axillary adenopathy. Pelvic: External genitalia:  no lesions              Urethra:  normal appearing urethra with no masses, tenderness or lesions              Bartholins and Skenes: normal                 Vagina: normal  appearing vagina with normal color and discharge, no lesions. Atrophic changes              Cervix: no lesions Bimanual Exam:  Uterus:  no masses or tenderness              Adnexa: no mass, fullness, tenderness              Rectovaginal: Deferred              Anus:  normal, no lesions  Lauren Garden, NP student present as chaperone  Assessment/Plan:  73 y.o. G1P1 for breast and pelvic exam.   Encounter for breast and pelvic examination - Education provided on SBEs, importance of preventative screenings, current guidelines, high calcium diet, regular exercise, and multivitamin daily. Labs with PCP.   Postmenopausal - No HRT, no bleeding  Screening for cervical cancer - Normal Pap history. No longer screening per guidelines.   Screening for breast cancer - Normal mammogram history.  Continue annual screenings.  Normal breast exam today.  Screening for colon cancer - 2017 colonoscopy. Will repeat at 10-year interval per GI's recommendation.   Screening for osteoporosis - Normal bone density in 2018. Continue Vitamin D + Calcium supplement and regular exercise. She is very active with exercise. Plans to get personal trainer.    Return in about 2 years (around 04/10/2026) for B&P.    Lauren Bamberger DNP, 12:07 PM 04/10/2024

## 2024-06-12 DIAGNOSIS — Z1331 Encounter for screening for depression: Secondary | ICD-10-CM | POA: Diagnosis not present

## 2024-06-12 DIAGNOSIS — Z780 Asymptomatic menopausal state: Secondary | ICD-10-CM | POA: Diagnosis not present

## 2024-06-12 DIAGNOSIS — I10 Essential (primary) hypertension: Secondary | ICD-10-CM | POA: Diagnosis not present

## 2024-06-12 DIAGNOSIS — Z0000 Encounter for general adult medical examination without abnormal findings: Secondary | ICD-10-CM | POA: Diagnosis not present

## 2024-06-12 DIAGNOSIS — E7800 Pure hypercholesterolemia, unspecified: Secondary | ICD-10-CM | POA: Diagnosis not present

## 2024-07-05 DIAGNOSIS — R051 Acute cough: Secondary | ICD-10-CM | POA: Diagnosis not present

## 2024-07-05 DIAGNOSIS — R0981 Nasal congestion: Secondary | ICD-10-CM | POA: Diagnosis not present

## 2024-07-05 DIAGNOSIS — R0982 Postnasal drip: Secondary | ICD-10-CM | POA: Diagnosis not present

## 2024-08-28 DIAGNOSIS — M65312 Trigger thumb, left thumb: Secondary | ICD-10-CM | POA: Diagnosis not present

## 2024-09-10 ENCOUNTER — Encounter: Payer: Self-pay | Admitting: Radiology

## 2024-10-15 DIAGNOSIS — H5203 Hypermetropia, bilateral: Secondary | ICD-10-CM | POA: Diagnosis not present

## 2024-10-15 DIAGNOSIS — H52223 Regular astigmatism, bilateral: Secondary | ICD-10-CM | POA: Diagnosis not present

## 2024-10-15 DIAGNOSIS — H524 Presbyopia: Secondary | ICD-10-CM | POA: Diagnosis not present
